# Patient Record
Sex: Female | Born: 1980 | Race: White | Hispanic: No | Marital: Married | Smoking: Never smoker
Health system: Southern US, Community
[De-identification: ages and names within clinical notes are randomized; demographics above are authoritative.]

## PROBLEM LIST (undated history)

## (undated) DIAGNOSIS — F32A Depression, unspecified: Secondary | ICD-10-CM

## (undated) DIAGNOSIS — E039 Hypothyroidism, unspecified: Secondary | ICD-10-CM

## (undated) DIAGNOSIS — F419 Anxiety disorder, unspecified: Secondary | ICD-10-CM

## (undated) DIAGNOSIS — N200 Calculus of kidney: Secondary | ICD-10-CM

## (undated) DIAGNOSIS — IMO0001 Reserved for inherently not codable concepts without codable children: Secondary | ICD-10-CM

## (undated) DIAGNOSIS — Z9889 Other specified postprocedural states: Secondary | ICD-10-CM

## (undated) DIAGNOSIS — R002 Palpitations: Secondary | ICD-10-CM

## (undated) DIAGNOSIS — M1990 Unspecified osteoarthritis, unspecified site: Secondary | ICD-10-CM

## (undated) DIAGNOSIS — O3660X0 Maternal care for excessive fetal growth, unspecified trimester, not applicable or unspecified: Secondary | ICD-10-CM

## (undated) DIAGNOSIS — I517 Cardiomegaly: Secondary | ICD-10-CM

## (undated) DIAGNOSIS — D369 Benign neoplasm, unspecified site: Secondary | ICD-10-CM

## (undated) DIAGNOSIS — I340 Nonrheumatic mitral (valve) insufficiency: Secondary | ICD-10-CM

## (undated) DIAGNOSIS — E079 Disorder of thyroid, unspecified: Secondary | ICD-10-CM

## (undated) HISTORY — PX: COLONOSCOPY: SHX174

## (undated) HISTORY — DX: Hypothyroidism, unspecified: E03.9

## (undated) HISTORY — DX: Reserved for inherently not codable concepts without codable children: IMO0001

## (undated) HISTORY — PX: DILATION AND CURETTAGE OF UTERUS: SHX78

## (undated) HISTORY — DX: Maternal care for excessive fetal growth, unspecified trimester, not applicable or unspecified: O36.60X0

## (undated) HISTORY — DX: Benign neoplasm, unspecified site: D36.9

## (undated) HISTORY — DX: Palpitations: R00.2

---

## 2007-09-03 ENCOUNTER — Emergency Department (HOSPITAL_COMMUNITY): Admission: EM | Admit: 2007-09-03 | Discharge: 2007-09-04 | Payer: Self-pay | Admitting: Emergency Medicine

## 2008-11-27 ENCOUNTER — Emergency Department (HOSPITAL_BASED_OUTPATIENT_CLINIC_OR_DEPARTMENT_OTHER): Admission: EM | Admit: 2008-11-27 | Discharge: 2008-11-27 | Payer: Self-pay | Admitting: Emergency Medicine

## 2011-10-01 LAB — URINE MICROSCOPIC-ADD ON

## 2011-10-01 LAB — URINALYSIS, ROUTINE W REFLEX MICROSCOPIC
Bilirubin Urine: NEGATIVE
Glucose, UA: NEGATIVE
Ketones, ur: NEGATIVE
Protein, ur: 30 — AB
Urobilinogen, UA: 0.2

## 2011-10-01 LAB — POCT PREGNANCY, URINE: Preg Test, Ur: NEGATIVE

## 2012-03-27 ENCOUNTER — Emergency Department (HOSPITAL_BASED_OUTPATIENT_CLINIC_OR_DEPARTMENT_OTHER)
Admission: EM | Admit: 2012-03-27 | Discharge: 2012-03-27 | Disposition: A | Discharge status: Home / Self Care | Payer: Medicaid Other | Attending: Emergency Medicine | Admitting: Emergency Medicine

## 2012-03-27 ENCOUNTER — Emergency Department (INDEPENDENT_AMBULATORY_CARE_PROVIDER_SITE_OTHER): Payer: Medicaid Other

## 2012-03-27 ENCOUNTER — Encounter (HOSPITAL_BASED_OUTPATIENT_CLINIC_OR_DEPARTMENT_OTHER): Payer: Self-pay | Admitting: *Deleted

## 2012-03-27 DIAGNOSIS — R0602 Shortness of breath: Secondary | ICD-10-CM | POA: Insufficient documentation

## 2012-03-27 DIAGNOSIS — R002 Palpitations: Secondary | ICD-10-CM

## 2012-03-27 DIAGNOSIS — I499 Cardiac arrhythmia, unspecified: Secondary | ICD-10-CM | POA: Insufficient documentation

## 2012-03-27 DIAGNOSIS — Z87891 Personal history of nicotine dependence: Secondary | ICD-10-CM | POA: Insufficient documentation

## 2012-03-27 DIAGNOSIS — R079 Chest pain, unspecified: Secondary | ICD-10-CM | POA: Insufficient documentation

## 2012-03-27 DIAGNOSIS — R9389 Abnormal findings on diagnostic imaging of other specified body structures: Secondary | ICD-10-CM

## 2012-03-27 LAB — BASIC METABOLIC PANEL
BUN: 11 mg/dL (ref 6–23)
CO2: 25 mEq/L (ref 19–32)
Chloride: 102 mEq/L (ref 96–112)
GFR calc Af Amer: >90 mL/min (ref >90)
Potassium: 4 mEq/L (ref 3.5–5.1)

## 2012-03-27 LAB — DIFFERENTIAL
Basophils Relative: 0 % (ref 0–1)
Lymphocytes Relative: 29 % (ref 12–46)
Monocytes Relative: 8 % (ref 3–12)
Neutro Abs: 8.1 10*3/uL — ABNORMAL HIGH (ref 1.7–7.7)
Neutrophils Relative %: 61 % (ref 43–77)

## 2012-03-27 LAB — CBC
Hemoglobin: 13.7 g/dL (ref 12.0–15.0)
MCHC: 34.8 g/dL (ref 30.0–36.0)
RBC: 4.5 MIL/uL (ref 3.87–5.11)
WBC: 13.2 10*3/uL — ABNORMAL HIGH (ref 4.0–10.5)

## 2012-03-27 LAB — D-DIMER, QUANTITATIVE: D-Dimer, Quant: 0.3 ug/mL-FEU (ref 0.00–0.48)

## 2012-03-27 NOTE — ED Provider Notes (Signed)
History     CSN: [id number]  Arrival date & time 03/27/12  1927   First MD Initiated Contact with Patient 03/27/12 2027     9:17 PM HPI Pt reports SOB with Exertion since yesterday. Reports today began having palpitations that would occur every 5-10 minutes. Describes palpitations as a fluttering feeling. Also reports for the last 2 weeks has had intermittent chest pain. Reports FHx of early ACS, recent Road trip to [facility] a week ago, and she was a former smoker.  Patient is a 31 y.o. female presenting with palpitations. The history is provided by the patient.  Palpitations  This is a new problem. The current episode started 12 to 24 hours ago. The problem has been gradually worsening. The problem is associated with exercise. Associated symptoms include chest pain, irregular heartbeat and shortness of breath. Pertinent negatives include no fever, no syncope, no abdominal pain, no nausea, no vomiting, no headaches, no back pain, no leg pain, no weakness and no hemoptysis. She has tried bed rest for the symptoms. The treatment provided no relief.    History reviewed. No pertinent past medical history.  Past Surgical History  Procedure Date  . Cesarean section     No family history on file.  History  Substance Use Topics  . Smoking status: Former Games developer  . Smokeless tobacco: Never Used  . Alcohol Use: No    OB History    Grav Para Term Preterm Abortions TAB SAB Ect Mult Living                  Review of Systems  Constitutional: Negative for fever.  Respiratory: Positive for shortness of breath. Negative for hemoptysis.   Cardiovascular: Positive for chest pain and palpitations. Negative for syncope.  Gastrointestinal: Negative for nausea, vomiting and abdominal pain.  Musculoskeletal: Negative for back pain.  Neurological: Negative for weakness and headaches.  All other systems reviewed and are negative.    Allergies  Review of patient's allergies indicates no known  allergies.  Home Medications   Current Outpatient Rx  Name Route Sig Dispense Refill  . SLEEP AID PO Oral Take 2 tablets by mouth daily as needed. Patient used this medication to aid with her sleep.    . IBUPROFEN 200 MG PO TABS Oral Take 400 mg by mouth every 6 (six) hours as needed. Patient used this medication for her headache.      BP 132/86  Pulse 92  Temp(Src) 98.3 F (36.8 C) (Oral)  Resp 16  SpO2 98%  LMP 02/24/2012  Physical Exam  Vitals reviewed. Constitutional: She is oriented to person, place, and time. Vital signs are normal. She appears well-developed and well-nourished. No distress.  HENT:  Head: Normocephalic and atraumatic.  Eyes: Conjunctivae are normal. Pupils are equal, round, and reactive to light.  Neck: Normal range of motion. Neck supple.  Cardiovascular: Normal rate, regular rhythm and normal heart sounds.  Exam reveals no friction rub.   No murmur heard. Pulmonary/Chest: Effort normal and breath sounds normal. She has no wheezes. She has no rhonchi. She has no rales. She exhibits no tenderness.  Abdominal: Soft. Bowel sounds are normal. She exhibits no distension and no mass. There is no tenderness. There is no rebound and no guarding.  Musculoskeletal: Normal range of motion.  Neurological: She is alert and oriented to person, place, and time. Coordination normal.  Skin: Skin is warm and dry. No rash noted. No erythema. No pallor.    ED Course  Procedures  ED ECG REPORT   Date: 03/27/2012  EKG Time: 9:30 PM  Rate: 94  Rhythm: normal sinus rhythm,  there are no previous tracings available for comparison  Axis: Normal  Intervals:none  ST&T Change: Inverted T wave in V1-V3  Narrative Interpretation: abnormal but patient states several years ago she was told she had an abnormal EKG.     Results for orders placed during the hospital encounter of 03/27/12  CBC      Component Value Range   WBC 13.2 (*) 4.0 - 10.5 (K/uL)   RBC 4.50  3.87 - 5.11  (MIL/uL)   Hemoglobin 13.7  12.0 - 15.0 (g/dL)   HCT 16.1  09.6 - 04.5 (%)   MCV 87.6  78.0 - 100.0 (fL)   MCH 30.4  26.0 - 34.0 (pg)   MCHC 34.8  30.0 - 36.0 (g/dL)   RDW 40.9  81.1 - 91.4 (%)   Platelets 308  150 - 400 (K/uL)  DIFFERENTIAL      Component Value Range   Neutrophils Relative 61  43 - 77 (%)   Neutro Abs 8.1 (*) 1.7 - 7.7 (K/uL)   Lymphocytes Relative 29  12 - 46 (%)   Lymphs Abs 3.8  0.7 - 4.0 (K/uL)   Monocytes Relative 8  3 - 12 (%)   Monocytes Absolute 1.0  0.1 - 1.0 (K/uL)   Eosinophils Relative 2  0 - 5 (%)   Eosinophils Absolute 0.3  0.0 - 0.7 (K/uL)   Basophils Relative 0  0 - 1 (%)   Basophils Absolute 0.1  0.0 - 0.1 (K/uL)  BASIC METABOLIC PANEL      Component Value Range   Sodium 140  135 - 145 (mEq/L)   Potassium 4.0  3.5 - 5.1 (mEq/L)   Chloride 102  96 - 112 (mEq/L)   CO2 25  19 - 32 (mEq/L)   Glucose, Bld 95  70 - 99 (mg/dL)   BUN 11  6 - 23 (mg/dL)   Creatinine, Ser 7.82  0.50 - 1.10 (mg/dL)   Calcium 9.4  8.4 - 95.6 (mg/dL)   GFR calc non Af Amer >90  >90 (mL/min)   GFR calc Af Amer >90  >90 (mL/min)  D-DIMER, QUANTITATIVE      Component Value Range   D-Dimer, Quant 0.30  0.00 - 0.48 (ug/mL-FEU)  TROPONIN I      Component Value Range   Troponin I <0.30  <0.30 (ng/mL)   Dg Chest 2 View  03/27/2012  *RADIOLOGY REPORT*  Clinical Data: Palpitations and shortness of breath.  CHEST - 2 VIEW  Comparison: None.  Findings: The lungs are well-aerated.  Mild retrocardiac opacity is not well characterized on the lateral view and may simply reflect atelectasis.  There is no evidence of pleural effusion or pneumothorax.  The heart is normal in size; the mediastinal contour is within normal limits.  No acute osseous abnormalities are seen.  IMPRESSION: Mild retrocardiac opacity is not well characterized on the lateral view and may simply reflect atelectasis, though mild pneumonia cannot be entirely excluded.  Original Report Authenticated By: Tonia Ghent,  M.D.     MDM   Discussed Labs and imaging with patient and family. Advised no acute findings. D-dimer and troponin were normal. Advised close f/u with cardiology for further evaluation. Discussed inverted T-waves with patient. Patient voicing understanding and will return to the ED for worsening symptoms.        Thomasene Lot, PA-C 03/27/12  2340 

## 2012-03-27 NOTE — Discharge Instructions (Signed)
Palpitations  A palpitation is the feeling that your heartbeat is irregular or is faster than normal. Although this is frightening, it usually is not serious. Palpitations may be caused by excesses of smoking, caffeine, or alcohol. They are also brought on by stress and anxiety. Sometimes, they are caused by heart disease. Unless otherwise noted, your caregiver did not find any signs of serious illness at this time. HOME CARE INSTRUCTIONS  To help prevent palpitations:  Drink decaffeinated coffee, tea, and soda pop. Avoid chocolate.   If you smoke or drink alcohol, quit or cut down as much as possible.   Reduce your stress or anxiety level. Biofeedback, yoga, or meditation will help you relax. Physical activity such as swimming, jogging, or walking also may be helpful.  SEEK MEDICAL CARE IF:   You continue to have a fast heartbeat.   Your palpitations occur more often.  SEEK IMMEDIATE MEDICAL CARE IF: You develop chest pain, shortness of breath, severe headache, dizziness, or fainting. Document Released: 12/03/2000 Document Revised: 11/25/2011 Document Reviewed: 02/02/2008 ExitCare Patient Information 2012 [employer]. 

## 2012-03-27 NOTE — ED Notes (Signed)
Pt reports palpitations and SOB since yesterday evening- denies pain- states "it feels like a flutter"

## 2012-04-15 NOTE — ED Provider Notes (Signed)
Evaluation and management procedures were performed by the PA/NP/resident physician under my supervision/collaboration.   Mykel Mohl D Euell Schiff, MD 04/15/12 2010 

## 2014-10-08 ENCOUNTER — Encounter (HOSPITAL_BASED_OUTPATIENT_CLINIC_OR_DEPARTMENT_OTHER): Payer: Self-pay | Admitting: Emergency Medicine

## 2014-10-08 ENCOUNTER — Emergency Department (HOSPITAL_BASED_OUTPATIENT_CLINIC_OR_DEPARTMENT_OTHER): Payer: Medicaid Other

## 2014-10-08 ENCOUNTER — Emergency Department (HOSPITAL_BASED_OUTPATIENT_CLINIC_OR_DEPARTMENT_OTHER)
Admission: EM | Admit: 2014-10-08 | Discharge: 2014-10-08 | Disposition: A | Discharge status: Home / Self Care | Payer: Medicaid Other | Attending: Emergency Medicine | Admitting: Emergency Medicine

## 2014-10-08 DIAGNOSIS — N3001 Acute cystitis with hematuria: Secondary | ICD-10-CM | POA: Insufficient documentation

## 2014-10-08 DIAGNOSIS — Z3202 Encounter for pregnancy test, result negative: Secondary | ICD-10-CM | POA: Insufficient documentation

## 2014-10-08 DIAGNOSIS — Z87891 Personal history of nicotine dependence: Secondary | ICD-10-CM | POA: Insufficient documentation

## 2014-10-08 DIAGNOSIS — Z87442 Personal history of urinary calculi: Secondary | ICD-10-CM | POA: Insufficient documentation

## 2014-10-08 DIAGNOSIS — M549 Dorsalgia, unspecified: Secondary | ICD-10-CM | POA: Diagnosis present

## 2014-10-08 HISTORY — DX: Calculus of kidney: N20.0

## 2014-10-08 LAB — URINALYSIS, ROUTINE W REFLEX MICROSCOPIC
BILIRUBIN URINE: NEGATIVE
Glucose, UA: NEGATIVE mg/dL
KETONES UR: NEGATIVE mg/dL
NITRITE: NEGATIVE
PH: 6 (ref 5.0–8.0)
Protein, ur: 30 mg/dL — AB
SPECIFIC GRAVITY, URINE: 1.028 (ref 1.005–1.030)
Urobilinogen, UA: 0.2 mg/dL (ref 0.0–1.0)

## 2014-10-08 LAB — CBC
HCT: 39.8 % (ref 36.0–46.0)
Hemoglobin: 13.3 g/dL (ref 12.0–15.0)
MCH: 29.4 pg (ref 26.0–34.0)
MCHC: 33.4 g/dL (ref 30.0–36.0)
MCV: 88.1 fL (ref 78.0–100.0)
PLATELETS: 310 10*3/uL (ref 150–400)
RBC: 4.52 MIL/uL (ref 3.87–5.11)
RDW: 12.6 % (ref 11.5–15.5)
WBC: 11.3 10*3/uL — AB (ref 4.0–10.5)

## 2014-10-08 LAB — BASIC METABOLIC PANEL
ANION GAP: 14 (ref 5–15)
BUN: 12 mg/dL (ref 6–23)
CHLORIDE: 101 meq/L (ref 96–112)
CO2: 26 mEq/L (ref 19–32)
Calcium: 9.1 mg/dL (ref 8.4–10.5)
Creatinine, Ser: 0.7 mg/dL (ref 0.50–1.10)
Glucose, Bld: 95 mg/dL (ref 70–99)
POTASSIUM: 4.3 meq/L (ref 3.7–5.3)
SODIUM: 141 meq/L (ref 137–147)

## 2014-10-08 LAB — PREGNANCY, URINE: Preg Test, Ur: NEGATIVE

## 2014-10-08 LAB — URINE MICROSCOPIC-ADD ON

## 2014-10-08 MED ORDER — CEFTRIAXONE SODIUM 1 G IJ SOLR
INTRAMUSCULAR | Status: AC
Start: 2014-10-08 — End: 2014-10-08
  Filled 2014-10-08: qty 10

## 2014-10-08 MED ORDER — HYDROCODONE-ACETAMINOPHEN 5-325 MG PO TABS: 1.0000 | ORAL_TABLET | Freq: Four times a day (QID) | ORAL | Status: DC | PRN

## 2014-10-08 MED ORDER — MORPHINE SULFATE 4 MG/ML IJ SOLN
4.0000 mg | Freq: Once | INTRAMUSCULAR | Status: AC
  Administered 2014-10-08: 4 mg via INTRAVENOUS
  Filled 2014-10-08: qty 1

## 2014-10-08 MED ORDER — CEFTRIAXONE SODIUM 1 G IJ SOLR
1.0000 g | Freq: Once | INTRAMUSCULAR | Status: AC
  Administered 2014-10-08: 1 g via INTRAVENOUS

## 2014-10-08 MED ORDER — CEPHALEXIN 500 MG PO CAPS: 500.0000 mg | ORAL_CAPSULE | Freq: Two times a day (BID) | ORAL | Status: DC

## 2014-10-08 MED ORDER — SODIUM CHLORIDE 0.9 % IV BOLUS (SEPSIS)
1000.0000 mL | Freq: Once | INTRAVENOUS | Status: AC
  Administered 2014-10-08: 1000 mL via INTRAVENOUS

## 2014-10-08 NOTE — ED Notes (Signed)
Pt c/o lower back pain x 1 day HX kidneystones

## 2014-10-08 NOTE — Discharge Instructions (Signed)

## 2014-10-08 NOTE — ED Provider Notes (Signed)
CSN: [id number]     Arrival date & time 10/08/14  1413 History   First MD Initiated Contact with Patient 10/08/14 1502     Chief Complaint  Patient presents with  . Back Pain     (Consider location/radiation/quality/duration/timing/severity/associated sxs/prior Treatment) Patient is a 33 y.o. female presenting with back pain.  Back Pain Location:  Lumbar spine Quality:  Aching Radiates to:  Does not radiate Pain severity:  Moderate Pain is:  Same all the time Onset quality:  Gradual Duration:  1 day Timing:  Constant Progression:  Unchanged Chronicity:  New Relieved by:  Nothing Worsened by:  Nothing tried Associated symptoms: abdominal pain and dysuria   Associated symptoms: no chest pain, no fever, no numbness, no paresthesias and no weakness     Past Medical History  Diagnosis Date  . Kidney stone    Past Surgical History  Procedure Laterality Date  . Cesarean section     History reviewed. No pertinent family history. History  Substance Use Topics  . Smoking status: Former Research scientist (life sciences)  . Smokeless tobacco: Never Used  . Alcohol Use: No   OB History   Grav Para Term Preterm Abortions TAB SAB Ect Mult Living                 Review of Systems  Constitutional: Negative for fever.  Cardiovascular: Negative for chest pain.  Gastrointestinal: Positive for abdominal pain.  Genitourinary: Positive for dysuria.  Musculoskeletal: Positive for back pain.  Neurological: Negative for weakness, numbness and paresthesias.  All other systems reviewed and are negative.     Allergies  Review of patient's allergies indicates no known allergies.  Home Medications   Prior to Admission medications   Medication Sig Start Date End Date Taking? Authorizing Provider  Doxylamine Succinate, Sleep, (SLEEP AID PO) Take 2 tablets by mouth daily as needed. Patient used this medication to aid with her sleep.    Historical Provider, MD  ibuprofen (ADVIL,MOTRIN) 200 MG tablet Take 400  mg by mouth every 6 (six) hours as needed. Patient used this medication for her headache.    Historical Provider, MD   BP 139/84  Pulse 72  Temp(Src) 98.3 F (36.8 C) (Oral)  Resp 16  SpO2 100%  LMP 09/10/2014 Physical Exam  Nursing note and vitals reviewed. Constitutional: She is oriented to person, place, and time. She appears well-developed and well-nourished. No distress.  HENT:  Head: Normocephalic and atraumatic.  Mouth/Throat: Oropharynx is clear and moist.  Eyes: EOM are normal. Pupils are equal, round, and reactive to light.  Neck: Normal range of motion. Neck supple.  Cardiovascular: Normal rate and regular rhythm.  Exam reveals no friction rub.   No murmur heard. Pulmonary/Chest: Effort normal and breath sounds normal. No respiratory distress. She has no wheezes. She has no rales.  Abdominal: Soft. She exhibits no distension. There is tenderness (very mild RLQ). There is no rebound.  Musculoskeletal: Normal range of motion. She exhibits no edema.       Lumbar back: She exhibits tenderness (bilateral musculoskeletal, no midline). She exhibits normal range of motion and no bony tenderness.  Neurological: She is alert and oriented to person, place, and time.  Skin: No rash noted. She is not diaphoretic.    ED Course  Procedures (including critical care time) Labs Review Labs Reviewed  URINALYSIS, ROUTINE W REFLEX MICROSCOPIC - Abnormal; Notable for the following:    APPearance CLOUDY (*)    Hgb urine dipstick MODERATE (*)  Protein, ur 30 (*)    Leukocytes, UA LARGE (*)    All other components within normal limits  URINE MICROSCOPIC-ADD ON - Abnormal; Notable for the following:    Bacteria, UA MANY (*)    All other components within normal limits  PREGNANCY, URINE  CBC  BASIC METABOLIC PANEL    Imaging Review Ct Renal Stone Study  10/08/2014   CLINICAL DATA:  Kidney stone, Bilateral lower back pain, hematuria for 1 day, history of kidney stones  EXAM: CT  ABDOMEN AND PELVIS WITHOUT CONTRAST  TECHNIQUE: Multidetector CT imaging of the abdomen and pelvis was performed following the standard protocol without IV contrast.  COMPARISON:  None.  FINDINGS: Lung bases are unremarkable. Sagittal images of the spine shows mild degenerative changes thoracic spine.  Unenhanced liver shows no biliary ductal dilatation. No calcified gallstones are noted within gallbladder. Unenhanced pancreas, spleen and adrenal glands are unremarkable. Unenhanced kidneys are symmetrical in size. No nephrolithiasis. No hydronephrosis or hydroureter. No calcified ureteral calculi are noted bilaterally.  No aortic aneurysm.  Moderate colonic stool. No pericecal inflammation. Normal appendix.  Bilateral distal ureter is unremarkable. Unenhanced uterus is unremarkable. There is a left ovarian cyst measures 2.8 cm. A right pelvic phlebolith is noted.  IMPRESSION: 1. No nephrolithiasis.  No hydronephrosis or hydroureter. 2. No calcified ureteral calculi are noted. 3. Normal appendix. No pericecal inflammation. Moderate colonic stool. 4. A left ovarian cyst measures 2.8 cm.   Electronically Signed   By: Lahoma Crocker M.D.   On: 10/08/2014 15:53     EKG Interpretation None      MDM   Final diagnoses:  Back pain  Acute cystitis with hematuria    19F with hx of kidney stones presents with back pain since last night. No fever, no nausea/vomiting. Having some hematuria since last night also. No complicated kidney stones, never required stents or lithotripsy. Here vitals stable, very mild RLQ abdominal pain. Mild lower back pain, no midline tenderness.  Will scan for stone. UTI noted, will give rocephin. Scan negative. Will treat for UTI. Stable for discharge.  Evelina Bucy, MD 10/08/14 1728

## 2014-12-16 ENCOUNTER — Encounter (HOSPITAL_BASED_OUTPATIENT_CLINIC_OR_DEPARTMENT_OTHER): Payer: Self-pay

## 2014-12-16 ENCOUNTER — Emergency Department (HOSPITAL_BASED_OUTPATIENT_CLINIC_OR_DEPARTMENT_OTHER): Payer: Medicaid Other

## 2014-12-16 ENCOUNTER — Emergency Department (HOSPITAL_BASED_OUTPATIENT_CLINIC_OR_DEPARTMENT_OTHER)
Admission: EM | Admit: 2014-12-16 | Discharge: 2014-12-16 | Disposition: A | Discharge status: Home / Self Care | Payer: Medicaid Other | Attending: Emergency Medicine | Admitting: Emergency Medicine

## 2014-12-16 DIAGNOSIS — Z87442 Personal history of urinary calculi: Secondary | ICD-10-CM | POA: Diagnosis not present

## 2014-12-16 DIAGNOSIS — R059 Cough, unspecified: Secondary | ICD-10-CM

## 2014-12-16 DIAGNOSIS — Z87891 Personal history of nicotine dependence: Secondary | ICD-10-CM | POA: Diagnosis not present

## 2014-12-16 DIAGNOSIS — R05 Cough: Secondary | ICD-10-CM | POA: Insufficient documentation

## 2014-12-16 DIAGNOSIS — Z792 Long term (current) use of antibiotics: Secondary | ICD-10-CM | POA: Diagnosis not present

## 2014-12-16 DIAGNOSIS — Z79899 Other long term (current) drug therapy: Secondary | ICD-10-CM | POA: Diagnosis not present

## 2014-12-16 DIAGNOSIS — Z8701 Personal history of pneumonia (recurrent): Secondary | ICD-10-CM | POA: Insufficient documentation

## 2014-12-16 MED ORDER — ALBUTEROL SULFATE HFA 108 (90 BASE) MCG/ACT IN AERS
2.0000 | INHALATION_SPRAY | RESPIRATORY_TRACT | Status: DC | PRN
  Administered 2014-12-16: 2 via RESPIRATORY_TRACT
  Filled 2014-12-16: qty 6.7

## 2014-12-16 NOTE — Discharge Instructions (Signed)
Cough, Adult   A cough is a reflex. It helps you clear your throat and airways. A cough can help heal your body. A cough can last 2 or 3 weeks (acute) or may last more than 8 weeks (chronic). Some common causes of a cough can include an infection, allergy, or a cold.  HOME CARE  · Only take medicine as told by your doctor.  · If given, take your medicines (antibiotics) as told. Finish them even if you start to feel better.  · Use a cold steam vaporizer or humidifier in your home. This can help loosen thick spit (secretions).  · Sleep so you are almost sitting up (semi-upright). Use pillows to do this. This helps reduce coughing.  · Rest as needed.  · Stop smoking if you smoke.  GET HELP RIGHT AWAY IF:  · You have yellowish-white fluid (pus) in your thick spit.  · Your cough gets worse.  · Your medicine does not reduce coughing, and you are losing sleep.  · You cough up blood.  · You have trouble breathing.  · Your pain gets worse and medicine does not help.  · You have a fever.  MAKE SURE YOU:   · Understand these instructions.  · Will watch your condition.  · Will get help right away if you are not doing well or get worse.  Document Released: 08/19/2011 Document Revised: 04/22/2014 Document Reviewed: 08/19/2011  ExitCare® Patient Information ©2015 [employer]. This information is not intended to replace advice given to you by your health care provider. Make sure you discuss any questions you have with your health care provider.

## 2014-12-16 NOTE — ED Notes (Signed)
Pt reports did not have a chest xray but MD told her that her lungs had crackles.  Pt reports also has not been able to eat in 5 days.

## 2014-12-16 NOTE — ED Provider Notes (Signed)
CSN: [id number]     Arrival date & time 12/16/14  1359 History   First MD Initiated Contact with Patient 12/16/14 1458     Chief Complaint  Patient presents with  . Cough     (Consider location/radiation/quality/duration/timing/severity/associated sxs/prior Treatment) HPI Comments: Pt comes in with complaint of continued coughing. Pt was seen on Saturday and diagnosed with pneumonia. Pt was treated with inhaler, steroids, antibiotics and cough medication. Pt states that she is not having fever but the cough is persistent  The history is provided by the patient. No language interpreter was used.    Past Medical History  Diagnosis Date  . Kidney stone    Past Surgical History  Procedure Laterality Date  . Cesarean section     No family history on file. History  Substance Use Topics  . Smoking status: Former Research scientist (life sciences)  . Smokeless tobacco: Never Used  . Alcohol Use: No   OB History    No data available     Review of Systems  All other systems reviewed and are negative.     Allergies  Review of patient's allergies indicates no known allergies.  Home Medications   Prior to Admission medications   Medication Sig Start Date End Date Taking? Authorizing Provider  albuterol (PROVENTIL HFA;VENTOLIN HFA) 108 (90 BASE) MCG/ACT inhaler Inhale 2 puffs into the lungs every 6 (six) hours as needed for wheezing or shortness of breath.   Yes Historical Provider, MD  guaiFENesin-codeine (ROBITUSSIN AC) 100-10 MG/5ML syrup Take 5 mLs by mouth 3 (three) times daily as needed for cough.   Yes Historical Provider, MD  sulfamethoxazole-trimethoprim (BACTRIM DS,SEPTRA DS) 800-160 MG per tablet Take 1 tablet by mouth 2 (two) times daily.   Yes Historical Provider, MD  cephALEXin (KEFLEX) 500 MG capsule Take 1 capsule (500 mg total) by mouth 2 (two) times daily. 10/08/14   Evelina Bucy, MD  Doxylamine Succinate, Sleep, (SLEEP AID PO) Take 2 tablets by mouth daily as needed. Patient used this  medication to aid with her sleep.    Historical Provider, MD  HYDROcodone-acetaminophen (NORCO/VICODIN) 5-325 MG per tablet Take 1 tablet by mouth every 6 (six) hours as needed for moderate pain. 10/08/14   Evelina Bucy, MD  ibuprofen (ADVIL,MOTRIN) 200 MG tablet Take 400 mg by mouth every 6 (six) hours as needed. Patient used this medication for her headache.    Historical Provider, MD   BP 119/73 mmHg  Pulse 95  Temp(Src) 99 F (37.2 C) (Oral)  Resp 18  Ht 5\' 2"  (1.575 m)  Wt 194 lb (87.998 kg)  BMI 35.47 kg/m2  SpO2 96%  LMP 12/12/2014 Physical Exam  Constitutional: She is oriented to person, place, and time. She appears well-developed and well-nourished.  HENT:  Head: Normocephalic.  Cardiovascular: Normal rate and regular rhythm.   Pulmonary/Chest: Effort normal and breath sounds normal.  Abdominal: Soft. Bowel sounds are normal.  Musculoskeletal: Normal range of motion.  Neurological: She is alert and oriented to person, place, and time. Coordination normal.  Skin: Skin is warm and dry.  Psychiatric: She has a normal mood and affect.  Nursing note and vitals reviewed.   ED Course  Procedures (including critical care time) Labs Review Labs Reviewed - No data to display  Imaging Review Dg Chest 2 View  12/16/2014   CLINICAL DATA:  Cough and chest pain. Congestion for a few days. Nonsmoker.  EXAM: CHEST  2 VIEW  COMPARISON:  03/27/2012  FINDINGS: The heart size and mediastinal  contours are within normal limits. Both lungs are clear. The visualized skeletal structures are unremarkable.  IMPRESSION: No active cardiopulmonary disease.   Electronically Signed   By: Shon Hale M.D.   On: 12/16/2014 14:37     EKG Interpretation None      MDM   Final diagnoses:  Cough    No infection noted on x-ray. Pt given albuterol inhaler.don't think further change is medication is needed at this time    Glendell Docker, NP 12/16/14 1544  Pamella Pert, MD 12/16/14  1550

## 2014-12-16 NOTE — ED Notes (Signed)
Pt reports dx with bronchitis and pna on Saturday.  Pt reports been taking antbx and received a steriod shot and inhaler but not getting any better.  Reports painful in her chest when she coughs.

## 2015-04-04 ENCOUNTER — Emergency Department (HOSPITAL_BASED_OUTPATIENT_CLINIC_OR_DEPARTMENT_OTHER): Payer: Medicaid Other

## 2015-04-04 ENCOUNTER — Emergency Department (HOSPITAL_BASED_OUTPATIENT_CLINIC_OR_DEPARTMENT_OTHER)
Admission: EM | Admit: 2015-04-04 | Discharge: 2015-04-04 | Disposition: A | Discharge status: Home / Self Care | Payer: Medicaid Other | Attending: Emergency Medicine | Admitting: Emergency Medicine

## 2015-04-04 ENCOUNTER — Encounter (HOSPITAL_BASED_OUTPATIENT_CLINIC_OR_DEPARTMENT_OTHER): Payer: Self-pay

## 2015-04-04 DIAGNOSIS — Z87442 Personal history of urinary calculi: Secondary | ICD-10-CM | POA: Insufficient documentation

## 2015-04-04 DIAGNOSIS — R079 Chest pain, unspecified: Secondary | ICD-10-CM | POA: Insufficient documentation

## 2015-04-04 DIAGNOSIS — R002 Palpitations: Secondary | ICD-10-CM | POA: Insufficient documentation

## 2015-04-04 DIAGNOSIS — Z79899 Other long term (current) drug therapy: Secondary | ICD-10-CM | POA: Insufficient documentation

## 2015-04-04 DIAGNOSIS — Z87891 Personal history of nicotine dependence: Secondary | ICD-10-CM | POA: Insufficient documentation

## 2015-04-04 DIAGNOSIS — R0602 Shortness of breath: Secondary | ICD-10-CM | POA: Insufficient documentation

## 2015-04-04 DIAGNOSIS — I2510 Atherosclerotic heart disease of native coronary artery without angina pectoris: Secondary | ICD-10-CM | POA: Insufficient documentation

## 2015-04-04 DIAGNOSIS — F419 Anxiety disorder, unspecified: Secondary | ICD-10-CM | POA: Insufficient documentation

## 2015-04-04 DIAGNOSIS — Z3202 Encounter for pregnancy test, result negative: Secondary | ICD-10-CM | POA: Insufficient documentation

## 2015-04-04 DIAGNOSIS — Z792 Long term (current) use of antibiotics: Secondary | ICD-10-CM | POA: Insufficient documentation

## 2015-04-04 HISTORY — DX: Cardiomegaly: I51.7

## 2015-04-04 HISTORY — DX: Nonrheumatic mitral (valve) insufficiency: I34.0

## 2015-04-04 LAB — CBC WITH DIFFERENTIAL/PLATELET
BASOS PCT: 0 % (ref 0–1)
Basophils Absolute: 0 10*3/uL (ref 0.0–0.1)
Eosinophils Absolute: 0 10*3/uL (ref 0.0–0.7)
Eosinophils Relative: 0 % (ref 0–5)
HCT: 44.1 % (ref 36.0–46.0)
Hemoglobin: 15 g/dL (ref 12.0–15.0)
LYMPHS ABS: 1.5 10*3/uL (ref 0.7–4.0)
LYMPHS PCT: 13 % (ref 12–46)
MCH: 30.5 pg (ref 26.0–34.0)
MCHC: 34 g/dL (ref 30.0–36.0)
MCV: 89.6 fL (ref 78.0–100.0)
MONOS PCT: 6 % (ref 3–12)
Monocytes Absolute: 0.7 10*3/uL (ref 0.1–1.0)
Neutro Abs: 9.2 10*3/uL — ABNORMAL HIGH (ref 1.7–7.7)
Neutrophils Relative %: 81 % — ABNORMAL HIGH (ref 43–77)
PLATELETS: 365 10*3/uL (ref 150–400)
RBC: 4.92 MIL/uL (ref 3.87–5.11)
RDW: 13 % (ref 11.5–15.5)
WBC: 11.5 10*3/uL — ABNORMAL HIGH (ref 4.0–10.5)

## 2015-04-04 LAB — BASIC METABOLIC PANEL
Anion gap: 12 (ref 5–15)
BUN: 7 mg/dL (ref 6–23)
CALCIUM: 9.3 mg/dL (ref 8.4–10.5)
CO2: 23 mmol/L (ref 19–32)
CREATININE: 0.55 mg/dL (ref 0.50–1.10)
Chloride: 102 mmol/L (ref 96–112)
Glucose, Bld: 111 mg/dL — ABNORMAL HIGH (ref 70–99)
Potassium: 3.2 mmol/L — ABNORMAL LOW (ref 3.5–5.1)
SODIUM: 137 mmol/L (ref 135–145)

## 2015-04-04 LAB — TROPONIN I: Troponin I: <0.03 ng/mL (ref <0.031)

## 2015-04-04 LAB — PREGNANCY, URINE: PREG TEST UR: NEGATIVE

## 2015-04-04 LAB — D-DIMER, QUANTITATIVE: D-Dimer, Quant: 1.18 ug/mL-FEU — ABNORMAL HIGH (ref 0.00–0.48)

## 2015-04-04 MED ORDER — LORAZEPAM 1 MG PO TABS
1.0000 mg | ORAL_TABLET | Freq: Once | ORAL | Status: AC
  Administered 2015-04-04: 1 mg via ORAL
  Filled 2015-04-04: qty 1

## 2015-04-04 MED ORDER — IOHEXOL 350 MG/ML SOLN
100.0000 mL | Freq: Once | INTRAVENOUS | Status: AC | PRN
  Administered 2015-04-04: 100 mL via INTRAVENOUS

## 2015-04-04 NOTE — ED Notes (Signed)
Pt reports palpitations and shortness of breath since last night.

## 2015-04-04 NOTE — ED Notes (Signed)
Patient transported to CT.  Delay in CT was because pt wanted to wait until her Sister arrived before getting the Ct.

## 2015-04-04 NOTE — ED Notes (Signed)
MD at bedside. 

## 2015-04-04 NOTE — Discharge Instructions (Signed)
Palpitations °A palpitation is the feeling that your heartbeat is irregular or is faster than normal. It may feel like your heart is fluttering or skipping a beat. Palpitations are usually not a serious problem. However, in some cases, you may need further medical evaluation. °CAUSES  °Palpitations can be caused by: °· Smoking. °· Caffeine or other stimulants, such as diet pills or energy drinks. °· Alcohol. °· Stress and anxiety. °· Strenuous physical activity. °· Fatigue. °· Certain medicines. °· Heart disease, especially if you have a history of irregular heart rhythms (arrhythmias), such as atrial fibrillation, atrial flutter, or supraventricular tachycardia. °· An improperly working pacemaker or defibrillator. °DIAGNOSIS  °To find the cause of your palpitations, your health care provider will take your medical history and perform a physical exam. Your health care provider may also have you take a test called an ambulatory electrocardiogram (ECG). An ECG records your heartbeat patterns over a 24-hour period. You may also have other tests, such as: °· Transthoracic echocardiogram (TTE). During echocardiography, sound waves are used to evaluate how blood flows through your heart. °· Transesophageal echocardiogram (TEE). °· Cardiac monitoring. This allows your health care provider to monitor your heart rate and rhythm in real time. °· Holter monitor. This is a portable device that records your heartbeat and can help diagnose heart arrhythmias. It allows your health care provider to track your heart activity for several days, if needed. °· Stress tests by exercise or by giving medicine that makes the heart beat faster. °TREATMENT  °Treatment of palpitations depends on the cause of your symptoms and can vary greatly. Most cases of palpitations do not require any treatment other than time, relaxation, and monitoring your symptoms. Other causes, such as atrial fibrillation, atrial flutter, or supraventricular  tachycardia, usually require further treatment. °HOME CARE INSTRUCTIONS  °· Avoid: °· Caffeinated coffee, tea, soft drinks, diet pills, and energy drinks. °· Chocolate. °· Alcohol. °· Stop smoking if you smoke. °· Reduce your stress and anxiety. Things that can help you relax include: °· A method of controlling things in your body, such as your heartbeats, with your mind (biofeedback). °· Yoga. °· Meditation. °· Physical activity such as swimming, jogging, or walking. °· Get plenty of rest and sleep. °SEEK MEDICAL CARE IF:  °· You continue to have a fast or irregular heartbeat beyond 24 hours. °· Your palpitations occur more often. °SEEK IMMEDIATE MEDICAL CARE IF: °· You have chest pain or shortness of breath. °· You have a severe headache. °· You feel dizzy or you faint. °MAKE SURE YOU: °· Understand these instructions. °· Will watch your condition. °· Will get help right away if you are not doing well or get worse. °Document Released: 12/03/2000 Document Revised: 12/11/2013 Document Reviewed: 02/04/2012 °Exit[employer]® Patient Information ©2015 [employer]. This information is not intended to replace advice given to you by your health care provider. Make sure you discuss any questions you have with your health care provider. ° °Panic Attacks °Panic attacks are sudden, short-lived surges of severe anxiety, fear, or discomfort. They may occur for no reason when you are relaxed, when you are anxious, or when you are sleeping. Panic attacks may occur for a number of reasons:  °· Healthy people occasionally have panic attacks in extreme, life-threatening situations, such as war or natural disasters. Normal anxiety is a protective mechanism of the body that helps us react to danger (fight or flight response). °· Panic attacks are often seen with anxiety disorders, such as panic disorder, social anxiety   disorder, generalized anxiety disorder, and phobias. Anxiety disorders cause excessive or uncontrollable anxiety. They may  interfere with your relationships or other life activities. °· Panic attacks are sometimes seen with other mental illnesses, such as depression and posttraumatic stress disorder. °· Certain medical conditions, prescription medicines, and drugs of abuse can cause panic attacks. °SYMPTOMS  °Panic attacks start suddenly, peak within 20 minutes, and are accompanied by four or more of the following symptoms: °· Pounding heart or fast heart rate (palpitations). °· Sweating. °· Trembling or shaking. °· Shortness of breath or feeling smothered. °· Feeling choked. °· Chest pain or discomfort. °· Nausea or strange feeling in your stomach. °· Dizziness, light-headedness, or feeling like you will faint. °· Chills or hot flushes. °· Numbness or tingling in your lips or hands and feet. °· Feeling that things are not real or feeling that you are not yourself. °· Fear of losing control or going crazy. °· Fear of dying. °Some of these symptoms can mimic serious medical conditions. For example, you may think you are having a heart attack. Although panic attacks can be very scary, they are not life threatening. °DIAGNOSIS  °Panic attacks are diagnosed through an assessment by your health care provider. Your health care provider will ask questions about your symptoms, such as where and when they occurred. Your health care provider will also ask about your medical history and use of alcohol and drugs, including prescription medicines. Your health care provider may order blood tests or other studies to rule out a serious medical condition. Your health care provider may refer you to a mental health professional for further evaluation. °TREATMENT  °· Most healthy people who have one or two panic attacks in an extreme, life-threatening situation will not require treatment. °· The treatment for panic attacks associated with anxiety disorders or other mental illness typically involves counseling with a mental health professional, medicine, or  a combination of both. Your health care provider will help determine what treatment is best for you. °· Panic attacks due to physical illness usually go away with treatment of the illness. If prescription medicine is causing panic attacks, talk with your health care provider about stopping the medicine, decreasing the dose, or substituting another medicine. °· Panic attacks due to alcohol or drug abuse go away with abstinence. Some adults need professional help in order to stop drinking or using drugs. °HOME CARE INSTRUCTIONS  °· Take all medicines as directed by your health care provider.   °· Schedule and attend follow-up visits as directed by your health care provider. It is important to keep all your appointments. °SEEK MEDICAL CARE IF: °· You are not able to take your medicines as prescribed. °· Your symptoms do not improve or get worse. °SEEK IMMEDIATE MEDICAL CARE IF:  °· You experience panic attack symptoms that are different than your usual symptoms. °· You have serious thoughts about hurting yourself or others. °· You are taking medicine for panic attacks and have a serious side effect. °MAKE SURE YOU: °· Understand these instructions. °· Will watch your condition. °· Will get help right away if you are not doing well or get worse. °Document Released: 12/06/2005 Document Revised: 12/11/2013 Document Reviewed: 07/20/2013 °ExitCare® Patient Information ©2015 [employer]. This information is not intended to replace advice given to you by your health care provider. Make sure you discuss any questions you have with your health care provider. ° °

## 2015-04-05 NOTE — ED Provider Notes (Signed)
CSN: [id number]     Arrival date & time 04/04/15  1606 History   First MD Initiated Contact with Patient 04/04/15 1623     Chief Complaint  Patient presents with  . Palpitations     (Consider location/radiation/quality/duration/timing/severity/associated sxs/prior Treatment) HPI  This is a 34 -year-old who presents with chest pain. Patient reports that since 245 this morning she's been able to "catch my breath." She reports palpitations and chest pressure. She reports that has progressively gotten worse. Currently pain is 8/10. She cannot identify any alleviating or worsening factors. She has a history of anxiety but states that this feels worse. She took 0.5 of alprazolam which did not help. Denies any recent surgeries, estrogen use, lower leg swelling, history of DVT. She is former smoker.  Past Medical History  Diagnosis Date  . Kidney stone   . Mitral valve regurgitation   . Ventricular hypertrophy left  . Coronary artery disease    Past Surgical History  Procedure Laterality Date  . Cesarean section     History reviewed. No pertinent family history. History  Substance Use Topics  . Smoking status: Former Research scientist (life sciences)  . Smokeless tobacco: Never Used  . Alcohol Use: No   OB History    No data available     Review of Systems  Constitutional: Negative for fever.  Respiratory: Positive for chest tightness and shortness of breath. Negative for cough.   Cardiovascular: Positive for chest pain and palpitations. Negative for leg swelling.  Gastrointestinal: Negative for nausea, vomiting and abdominal pain.  Genitourinary: Negative for dysuria.  Musculoskeletal: Negative for back pain.  Neurological: Negative for headaches.  Psychiatric/Behavioral: Negative for confusion. The patient is nervous/anxious.   All other systems reviewed and are negative.     Allergies  Review of patient's allergies indicates no known allergies.  Home Medications   Prior to Admission  medications   Medication Sig Start Date End Date Taking? Authorizing Provider  ALPRAZolam Duanne Moron) 0.5 MG tablet Take 0.5 mg by mouth at bedtime as needed for anxiety.   Yes Historical Provider, MD  levothyroxine (SYNTHROID, LEVOTHROID) 125 MCG tablet Take 125 mcg by mouth daily before breakfast.   Yes Historical Provider, MD  zolpidem (AMBIEN) 5 MG tablet Take 5 mg by mouth at bedtime as needed for sleep.   Yes Historical Provider, MD  albuterol (PROVENTIL HFA;VENTOLIN HFA) 108 (90 BASE) MCG/ACT inhaler Inhale 2 puffs into the lungs every 6 (six) hours as needed for wheezing or shortness of breath.    Historical Provider, MD  cephALEXin (KEFLEX) 500 MG capsule Take 1 capsule (500 mg total) by mouth 2 (two) times daily. 10/08/14   Evelina Bucy, MD  Doxylamine Succinate, Sleep, (SLEEP AID PO) Take 2 tablets by mouth daily as needed. Patient used this medication to aid with her sleep.    Historical Provider, MD  guaiFENesin-codeine (ROBITUSSIN AC) 100-10 MG/5ML syrup Take 5 mLs by mouth 3 (three) times daily as needed for cough.    Historical Provider, MD  HYDROcodone-acetaminophen (NORCO/VICODIN) 5-325 MG per tablet Take 1 tablet by mouth every 6 (six) hours as needed for moderate pain. 10/08/14   Evelina Bucy, MD  ibuprofen (ADVIL,MOTRIN) 200 MG tablet Take 400 mg by mouth every 6 (six) hours as needed. Patient used this medication for her headache.    Historical Provider, MD  sulfamethoxazole-trimethoprim (BACTRIM DS,SEPTRA DS) 800-160 MG per tablet Take 1 tablet by mouth 2 (two) times daily.    Historical Provider, MD   BP 91/66 mmHg  Pulse 86  Temp(Src) 98.4 F (36.9 C) (Oral)  Resp 18  SpO2 99%  LMP 03/22/2015 Physical Exam  Constitutional: She is oriented to person, place, and time. She appears well-developed and well-nourished. No distress.  Anxious appearing  HENT:  Head: Normocephalic and atraumatic.  Eyes: Pupils are equal, round, and reactive to light.  Cardiovascular: Normal rate  and regular rhythm.   No murmur heard. Pulmonary/Chest: Effort normal and breath sounds normal. No respiratory distress. She has no wheezes.  Abdominal: Soft. Bowel sounds are normal. There is no tenderness. There is no rebound.  Musculoskeletal: She exhibits no edema.  Neurological: She is alert and oriented to person, place, and time.  Skin: Skin is warm and dry.  Psychiatric: She has a normal mood and affect.  Nursing note and vitals reviewed.   ED Course  Procedures (including critical care time) Labs Review Labs Reviewed  CBC WITH DIFFERENTIAL/PLATELET - Abnormal; Notable for the following:    WBC 11.5 (*)    Neutrophils Relative % 81 (*)    Neutro Abs 9.2 (*)    All other components within normal limits  BASIC METABOLIC PANEL - Abnormal; Notable for the following:    Potassium 3.2 (*)    Glucose, Bld 111 (*)    All other components within normal limits  D-DIMER, QUANTITATIVE - Abnormal; Notable for the following:    D-Dimer, Quant 1.18 (*)    All other components within normal limits  TROPONIN I  PREGNANCY, URINE    Imaging Review No results found.   EKG Interpretation   Date/Time:  Friday April 04 2015 16:11:00 EDT Ventricular Rate:  123 PR Interval:  146 QRS Duration: 82 QT Interval:  322 QTC Calculation: 460 R Axis:   43 Text Interpretation:  Sinus tachycardia Nonspecific ST and T wave  abnormality Abnormal ECG Confirmed by Danna Sewell  MD, Loma Sousa ([id number]) on  04/04/2015 4:49:00 PM      MDM   Final diagnoses:  Heart palpitations  Anxiety   Patient presents with chest pain, palpitations, and difficulty "catching her breath." Nontoxic on exam. Vital signs notable for tachycardia. Otherwise reassuring. Physical exam is benign. Low risk for blood clots. We'll send screening d-dimer. Otherwise chest x-ray and EKG reassuring as is other basic labwork. D-dimer positive. Patient sent for CT imaging of the chest which is negative for PE. Patient given Ativan reports  resolution of her symptoms. Suspect component of anxiety contributing to patient's symptoms. She would like to be discharged home. Discussed with patient she could return precautions.  After history, exam, and medical workup I feel the patient has been appropriately medically screened and is safe for discharge home. Pertinent diagnoses were discussed with the patient. Patient was given return precautions.     Merryl Hacker, MD 04/07/15 [phone]

## 2016-12-05 ENCOUNTER — Emergency Department (HOSPITAL_BASED_OUTPATIENT_CLINIC_OR_DEPARTMENT_OTHER)
Admission: EM | Admit: 2016-12-05 | Discharge: 2016-12-05 | Disposition: A | Discharge status: Home / Self Care | Payer: Medicaid Other | Attending: Emergency Medicine | Admitting: Emergency Medicine

## 2016-12-05 ENCOUNTER — Encounter (HOSPITAL_BASED_OUTPATIENT_CLINIC_OR_DEPARTMENT_OTHER): Payer: Self-pay | Admitting: Emergency Medicine

## 2016-12-05 DIAGNOSIS — Y929 Unspecified place or not applicable: Secondary | ICD-10-CM | POA: Insufficient documentation

## 2016-12-05 DIAGNOSIS — I2510 Atherosclerotic heart disease of native coronary artery without angina pectoris: Secondary | ICD-10-CM | POA: Insufficient documentation

## 2016-12-05 DIAGNOSIS — Z87891 Personal history of nicotine dependence: Secondary | ICD-10-CM | POA: Insufficient documentation

## 2016-12-05 DIAGNOSIS — Z79899 Other long term (current) drug therapy: Secondary | ICD-10-CM | POA: Diagnosis not present

## 2016-12-05 DIAGNOSIS — Z23 Encounter for immunization: Secondary | ICD-10-CM | POA: Diagnosis not present

## 2016-12-05 DIAGNOSIS — Y999 Unspecified external cause status: Secondary | ICD-10-CM | POA: Diagnosis not present

## 2016-12-05 DIAGNOSIS — S61310A Laceration without foreign body of right index finger with damage to nail, initial encounter: Secondary | ICD-10-CM | POA: Insufficient documentation

## 2016-12-05 DIAGNOSIS — W260XXA Contact with knife, initial encounter: Secondary | ICD-10-CM | POA: Diagnosis not present

## 2016-12-05 DIAGNOSIS — Y9389 Activity, other specified: Secondary | ICD-10-CM | POA: Insufficient documentation

## 2016-12-05 HISTORY — DX: Disorder of thyroid, unspecified: E07.9

## 2016-12-05 MED ORDER — TETANUS-DIPHTH-ACELL PERTUSSIS 5-2.5-18.5 LF-MCG/0.5 IM SUSP
0.5000 mL | Freq: Once | INTRAMUSCULAR | Status: AC
  Administered 2016-12-05: 0.5 mL via INTRAMUSCULAR
  Filled 2016-12-05: qty 0.5

## 2016-12-05 NOTE — Discharge Instructions (Signed)
Keep finger clean and dry.  Keep dressing in place while touching anything dirty, cooking, cleaning, etc to prevent infection. Follow-up with your primary care doctor. Return here for any new concerns.

## 2016-12-05 NOTE — ED Provider Notes (Signed)
[facility] Provider Note   CSN: [id number] Arrival date & time: 12/05/16  2157  By signing my name below, I, Soijett Blue, attest that this documentation has been prepared under the direction and in the presence of Quincy Carnes, PA-C Electronically Signed: Soijett Blue, ED Scribe. 12/05/16. 10:20 PM.  History   Chief Complaint Chief Complaint  Patient presents with  . Extremity Laceration    HPI Jenna Patterson is a 35 y.o. female who presents to the Emergency Department complaining of right index finger laceration onset PTA. She reports that she cut her right index finger on a cooking knife while making dinner. Pt notes that she is not UTD with her tetanus vaccination. Pt is left hand dominant. She is having associated symptoms of right index finger pain. She has tried applying pressure without medications for the relief of her symptoms. She denies color change, swelling, and any other symptoms.   The history is provided by the patient. No language interpreter was used.    Past Medical History:  Diagnosis Date  . Coronary artery disease   . Kidney stone   . Mitral valve regurgitation   . Thyroid disease   . Ventricular hypertrophy left    There are no active problems to display for this patient.   Past Surgical History:  Procedure Laterality Date  . CESAREAN SECTION      OB History    No data available       Home Medications    Prior to Admission medications   Medication Sig Start Date End Date Taking? Authorizing Provider  albuterol (PROVENTIL HFA;VENTOLIN HFA) 108 (90 BASE) MCG/ACT inhaler Inhale 2 puffs into the lungs every 6 (six) hours as needed for wheezing or shortness of breath.    Historical Provider, MD  ALPRAZolam Duanne Moron) 0.5 MG tablet Take 0.5 mg by mouth at bedtime as needed for anxiety.    Historical Provider, MD  cephALEXin (KEFLEX) 500 MG capsule Take 1 capsule (500 mg total) by mouth 2 (two) times daily. 10/08/14   Evelina Bucy, MD    Doxylamine Succinate, Sleep, (SLEEP AID PO) Take 2 tablets by mouth daily as needed. Patient used this medication to aid with her sleep.    Historical Provider, MD  guaiFENesin-codeine (ROBITUSSIN AC) 100-10 MG/5ML syrup Take 5 mLs by mouth 3 (three) times daily as needed for cough.    Historical Provider, MD  HYDROcodone-acetaminophen (NORCO/VICODIN) 5-325 MG per tablet Take 1 tablet by mouth every 6 (six) hours as needed for moderate pain. 10/08/14   Evelina Bucy, MD  ibuprofen (ADVIL,MOTRIN) 200 MG tablet Take 400 mg by mouth every 6 (six) hours as needed. Patient used this medication for her headache.    Historical Provider, MD  levothyroxine (SYNTHROID, LEVOTHROID) 125 MCG tablet Take 125 mcg by mouth daily before breakfast.    Historical Provider, MD  sulfamethoxazole-trimethoprim (BACTRIM DS,SEPTRA DS) 800-160 MG per tablet Take 1 tablet by mouth 2 (two) times daily.    Historical Provider, MD  zolpidem (AMBIEN) 5 MG tablet Take 5 mg by mouth at bedtime as needed for sleep.    Historical Provider, MD    Family History History reviewed. No pertinent family history.  Social History Social History  Substance Use Topics  . Smoking status: Former Research scientist (life sciences)  . Smokeless tobacco: Never Used  . Alcohol use No     Allergies   Patient has no known allergies.   Review of Systems Review of Systems  Musculoskeletal: Positive for arthralgias (right index  finger). Negative for joint swelling.  Skin: Positive for wound (laceration to right index finger). Negative for color change.  All other systems reviewed and are negative.    Physical Exam Updated Vital Signs BP 114/72 (BP Location: Right Arm)   Pulse 84   Temp 98.1 F (36.7 C) (Oral)   Resp 18   Ht 5\' 2"  (1.575 m)   Wt 130 lb (59 kg)   LMP 11/25/2016   SpO2 100%   BMI 23.78 kg/m   Physical Exam  Constitutional: She is oriented to person, place, and time. She appears well-developed and well-nourished. No distress.  HENT:   Head: Normocephalic and atraumatic.  Eyes: EOM are normal.  Neck: Neck supple.  Cardiovascular: Normal rate.   Pulmonary/Chest: Effort normal. No respiratory distress.  Abdominal: She exhibits no distension.  Musculoskeletal: Normal range of motion.       Right hand: She exhibits laceration.       Hands: Avulsion type laceration of distal right index finger, through distal portion of nail; wound is hemostatic; proximal portion of nail bed/matrix remains intact; full flexion/extension of finger maintained; normal cap refill; normal sensation throughout  Neurological: She is alert and oriented to person, place, and time.  Skin: Skin is warm and dry.  Psychiatric: She has a normal mood and affect. Her behavior is normal.  Nursing note and vitals reviewed.   ED Treatments / Results  DIAGNOSTIC STUDIES: Oxygen Saturation is 100% on RA, nl by my interpretation.    COORDINATION OF CARE: 10:17 PM Discussed treatment plan with pt at bedside which includes wound care and update tetanus vaccination and pt agreed to plan.  Procedures Procedures (including critical care time)  Medications Ordered in ED Medications - No data to display   Initial Impression / Assessment and Plan / ED Course  I have reviewed the triage vital signs and the nursing notes.   Clinical Course    35 year old female here with avulsion type laceration of the distal right index finger. Wound is hemostatic. There is damage to the distal nail, however proximal nail bed/matrix remains intact. She has full range of motion of the affected digit. Normal cap refill, normal sensation. Given nature of laceration, it cannot be repaired here.  Tetanus was updated.  Wound cleansed here, dressing applied.  Will d/c home with wound care instructions.  Recommended to keep covered if cooking, cleaning, etc. To help reduce changes of infection.  FU with PCP.  Discussed plan with patient, she acknowledged understanding and agreed with  plan of care.  Return precautions given for new or worsening symptoms.  Final Clinical Impressions(s) / ED Diagnoses   Final diagnoses:  Laceration of right index finger without foreign body with damage to nail, initial encounter    New Prescriptions New Prescriptions   No medications on file   I personally performed the services described in this documentation, which was scribed in my presence. The recorded information has been reviewed and is accurate.    Larene Pickett, PA-C 12/05/16 2256    Isla Pence, MD 12/06/16 0001

## 2016-12-05 NOTE — ED Notes (Signed)
R medial posterior index nail/nail bed tip avulsion, ~0.5cm, oozing blood. Cut with kitchen knife while cutting onions, Tdap updated here tonight, rates pain 5/10, soaking in saline at this time, (denies: nausea or other sx), not on blood thinners, NKDA.

## 2016-12-05 NOTE — ED Triage Notes (Signed)
Right 1st finger laceration  - bleeding controlled at this time with dressing

## 2016-12-05 NOTE — ED Notes (Signed)
EDPA into room, at [facility]. Pt seen by PA prior to RN assessment, see PA notes, pending orders.

## 2017-01-20 ENCOUNTER — Encounter: Payer: Self-pay | Admitting: Family Medicine

## 2017-01-20 ENCOUNTER — Other Ambulatory Visit (HOSPITAL_COMMUNITY)
Admission: RE | Admit: 2017-01-20 | Discharge: 2017-01-20 | Disposition: A | Discharge status: Home / Self Care | Payer: Medicaid Other | Source: Ambulatory Visit | Attending: Family Medicine | Admitting: Family Medicine

## 2017-01-20 ENCOUNTER — Ambulatory Visit (INDEPENDENT_AMBULATORY_CARE_PROVIDER_SITE_OTHER): Payer: Medicaid Other | Admitting: Family Medicine

## 2017-01-20 ENCOUNTER — Ambulatory Visit: Payer: Self-pay

## 2017-01-20 VITALS — BP 107/65 | HR 89 | Wt 170.8 lb

## 2017-01-20 DIAGNOSIS — Z3689 Encounter for other specified antenatal screening: Secondary | ICD-10-CM

## 2017-01-20 DIAGNOSIS — O0990 Supervision of high risk pregnancy, unspecified, unspecified trimester: Secondary | ICD-10-CM | POA: Insufficient documentation

## 2017-01-20 DIAGNOSIS — O3680X0 Pregnancy with inconclusive fetal viability, not applicable or unspecified: Secondary | ICD-10-CM | POA: Diagnosis not present

## 2017-01-20 DIAGNOSIS — Z124 Encounter for screening for malignant neoplasm of cervix: Secondary | ICD-10-CM

## 2017-01-20 DIAGNOSIS — N898 Other specified noninflammatory disorders of vagina: Secondary | ICD-10-CM

## 2017-01-20 DIAGNOSIS — Z113 Encounter for screening for infections with a predominantly sexual mode of transmission: Secondary | ICD-10-CM

## 2017-01-20 DIAGNOSIS — I340 Nonrheumatic mitral (valve) insufficiency: Secondary | ICD-10-CM | POA: Diagnosis not present

## 2017-01-20 DIAGNOSIS — Z01411 Encounter for gynecological examination (general) (routine) with abnormal findings: Secondary | ICD-10-CM | POA: Diagnosis present

## 2017-01-20 DIAGNOSIS — Z1151 Encounter for screening for human papillomavirus (HPV): Secondary | ICD-10-CM | POA: Insufficient documentation

## 2017-01-20 DIAGNOSIS — I517 Cardiomegaly: Secondary | ICD-10-CM | POA: Diagnosis not present

## 2017-01-20 DIAGNOSIS — R87810 Cervical high risk human papillomavirus (HPV) DNA test positive: Secondary | ICD-10-CM | POA: Diagnosis present

## 2017-01-20 DIAGNOSIS — O34219 Maternal care for unspecified type scar from previous cesarean delivery: Secondary | ICD-10-CM | POA: Insufficient documentation

## 2017-01-20 DIAGNOSIS — O0991 Supervision of high risk pregnancy, unspecified, first trimester: Secondary | ICD-10-CM

## 2017-01-20 DIAGNOSIS — O09529 Supervision of elderly multigravida, unspecified trimester: Secondary | ICD-10-CM | POA: Insufficient documentation

## 2017-01-20 DIAGNOSIS — E039 Hypothyroidism, unspecified: Secondary | ICD-10-CM | POA: Insufficient documentation

## 2017-01-20 DIAGNOSIS — O99280 Endocrine, nutritional and metabolic diseases complicating pregnancy, unspecified trimester: Secondary | ICD-10-CM

## 2017-01-20 DIAGNOSIS — E034 Atrophy of thyroid (acquired): Secondary | ICD-10-CM

## 2017-01-20 DIAGNOSIS — O09521 Supervision of elderly multigravida, first trimester: Secondary | ICD-10-CM | POA: Diagnosis not present

## 2017-01-20 LAB — POCT URINALYSIS DIP (DEVICE)
Bilirubin Urine: NEGATIVE
Glucose, UA: NEGATIVE mg/dL
Hgb urine dipstick: NEGATIVE
Ketones, ur: NEGATIVE mg/dL
NITRITE: NEGATIVE
Protein, ur: NEGATIVE mg/dL
Specific Gravity, Urine: 1.02 (ref 1.005–1.030)
UROBILINOGEN UA: 0.2 mg/dL (ref 0.0–1.0)
pH: 5.5 (ref 5.0–8.0)

## 2017-01-20 MED ORDER — TERCONAZOLE 0.8 % VA CREA: 1.0000 | TOPICAL_CREAM | Freq: Every day | VAGINAL | 0 refills | Status: DC

## 2017-01-20 NOTE — Progress Notes (Signed)
Subjective:    Jenna Patterson is a G3P1011 [redacted]w[redacted]d being seen today for her first obstetrical visit.  Her obstetrical history is significant for advanced maternal age, obesity and hypothyroid, MVR and LV enlargement, and prior Csearean Section.. Patient does intend to breast feed. Pregnancy history fully reviewed.  Patient reports no complaints.  Vitals:   01/20/17 1418  BP: 107/65  Pulse: 89  Weight: 170 lb 12.8 oz (77.5 kg)    HISTORY: OB History  Gravida Para Term Preterm AB Living  3 1 1  0 1 1  SAB TAB Ectopic Multiple Live Births  1 0 0 0 1    # Outcome Date GA Lbr Len/2nd Weight Sex Delivery Anes PTL Lv  3 Current           2 Term 03/10/01 [redacted]w[redacted]d  6 lb 13 oz (3.09 kg) F CS-Unspec  N LIV     Birth Comments: had c-section because "her pelvis wasn't big enough)  1 SAB              Past Medical History:  Diagnosis Date  . Coronary artery disease   . Hypothyroidism   . Kidney stone   . Mitral valve regurgitation   . Thyroid disease   . Ventricular hypertrophy left   Past Surgical History:  Procedure Laterality Date  . CESAREAN SECTION     History reviewed. No pertinent family history.   Exam    Uterus:   10 wk size  Pelvic Exam:    Perineum: Normal Perineum   Vulva: normal   Vagina:  normal mucosa, curdlike discharge   Cervix: no bleeding following Pap, no cervical motion tenderness and no lesions   Adnexa: normal adnexa and no mass, fullness, tenderness   Bony Pelvis: average  System: Breast:  normal appearance, no masses or tenderness   Skin: normal coloration and turgor, no rashes    Neurologic: negative   Extremities: normal strength, tone, and muscle mass, no deformities   HEENT extra ocular movement intact, sclera clear, anicteric and oropharynx clear, no lesions   Mouth/Teeth mucous membranes moist, pharynx normal without lesions   Neck supple   Cardiovascular: regular rate and rhythm, II-III/VI SEM   Respiratory:  appears well, vitals normal,  no respiratory distress, acyanotic, normal RR, ear and throat exam is normal, neck free of mass or lymphadenopathy, chest clear, no wheezing, crepitations, rhonchi, normal symmetric air entry   Abdomen: soft, non-tender; bowel sounds normal; no masses,  no organomegaly   Limited TA U/s reveals SIUP with + flicker FHR 0000000   Assessment/Plan:    Pregnancy: G3P1011 1. Supervision of high risk pregnancy in first trimester Baby Rx APP only - Prenatal Profile - Cystic fibrosis diagnostic study - Culture, OB Urine - Pain Mgmt, Profile 6 Conf w/o mM, U - Cytology - PAP - US OB Limited  2. Elderly multigravida in first trimester Genetics referral - [place] MFM Fetal Nuchal Translucency; Future - AMB referral to maternal fetal medicine  3. Hypothyroid in pregnancy, antepartum - TSH - adjust meds accordingly  4. Previous cesarean delivery, antepartum Discussed options, for consideration  5. Non-rheumatic mitral regurgitation States more of an issue when heavier, discussed potential complications of heart disease and impact of pregnancy. - Ambulatory referral to Cardiology  6. Ventricular hypertrophy To cards, will need ECHO - Ambulatory referral to Cardiology  7. Encounter to determine fetal viability of pregnancy, single or unspecified fetus - US OB Limited  8. Vaginal discharge - consistent with yeast  vaginitis Topical treatment - terconazole (TERAZOL 3) 0.8 % vaginal cream; Place 1 applicator vaginally at bedtime.  Dispense: 20 g; Refill: 0   Donnamae Jude 01/20/2017

## 2017-01-20 NOTE — Patient Instructions (Signed)
First Trimester of Pregnancy The first trimester of pregnancy is from week 1 until the end of week 12 (months 1 through 3). A week after a sperm fertilizes an egg, the egg will implant on the wall of the uterus. This embryo will begin to develop into a baby. Genes from you and your partner are forming the baby. The female genes determine whether the baby is a boy or a girl. At 6-8 weeks, the eyes and face are formed, and the heartbeat can be seen on ultrasound. At the end of 12 weeks, all the baby's organs are formed.  Now that you are pregnant, you will want to do everything you can to have a healthy baby. Two of the most important things are to get good prenatal care and to follow your health care provider's instructions. Prenatal care is all the medical care you receive before the baby's birth. This care will help prevent, find, and treat any problems during the pregnancy and childbirth. BODY CHANGES Your body goes through many changes during pregnancy. The changes vary from woman to woman.   You may gain or lose a couple of pounds at first.  You may feel sick to your stomach (nauseous) and throw up (vomit). If the vomiting is uncontrollable, call your health care provider.  You may tire easily.  You may develop headaches that can be relieved by medicines approved by your health care provider.  You may urinate more often. Painful urination may mean you have a bladder infection.  You may develop heartburn as a result of your pregnancy.  You may develop constipation because certain hormones are causing the muscles that push waste through your intestines to slow down.  You may develop hemorrhoids or swollen, bulging veins (varicose veins).  Your breasts may begin to grow larger and become tender. Your nipples may stick out more, and the tissue that surrounds them (areola) may become darker.  Your gums may bleed and may be sensitive to brushing and flossing.  Dark spots or blotches  (chloasma, mask of pregnancy) may develop on your face. This will likely fade after the baby is born.  Your menstrual periods will stop.  You may have a loss of appetite.  You may develop cravings for certain kinds of food.  You may have changes in your emotions from day to day, such as being excited to be pregnant or being concerned that something may go wrong with the pregnancy and baby.  You may have more vivid and strange dreams.  You may have changes in your hair. These can include thickening of your hair, rapid growth, and changes in texture. Some women also have hair loss during or after pregnancy, or hair that feels dry or thin. Your hair will most likely return to normal after your baby is born. WHAT TO EXPECT AT YOUR PRENATAL VISITS During a routine prenatal visit:  You will be weighed to make sure you and the baby are growing normally.  Your blood pressure will be taken.  Your abdomen will be measured to track your baby's growth.  The fetal heartbeat will be listened to starting around week 10 or 12 of your pregnancy.  Test results from any previous visits will be discussed. Your health care provider may ask you:  How you are feeling.  If you are feeling the baby move.  If you have had any abnormal symptoms, such as leaking fluid, bleeding, severe headaches, or abdominal cramping.  If you are using any tobacco  are feeling the baby move.  · If you have had any abnormal symptoms, such as leaking fluid, bleeding, severe headaches, or abdominal cramping.  · If you are using any tobacco products, including cigarettes, chewing tobacco, and electronic cigarettes.  · If you have any questions.  Other tests that may be performed during your first trimester include:  · Blood tests to find your blood type and to check for the presence of any previous infections. They will also be used to check for low iron levels (anemia) and Rh antibodies. Later in the pregnancy, blood tests for diabetes will be done along with other tests if problems develop.  · Urine tests to check for infections, diabetes, or protein in the urine.   · An ultrasound to confirm the proper growth and development of the baby.  · An amniocentesis to check for possible genetic problems.  · Fetal screens for spina bifida and Down syndrome.  · You may need other tests to make sure you and the baby are doing well.  · HIV (human immunodeficiency virus) testing. Routine prenatal testing includes screening for HIV, unless you choose not to have this test.  HOME CARE INSTRUCTIONS   Medicines  · Follow your health care provider's instructions regarding medicine use. Specific medicines may be either safe or unsafe to take during pregnancy.  · Take your prenatal vitamins as directed.  · If you develop constipation, try taking a stool softener if your health care provider approves.  Diet  · Eat regular, well-balanced meals. Choose a variety of foods, such as meat or vegetable-based protein, fish, milk and low-fat dairy products, vegetables, fruits, and whole grain breads and cereals. Your health care provider will help you determine the amount of weight gain that is right for you.  · Avoid raw meat and uncooked cheese. These carry germs that can cause birth defects in the baby.  · Eating four or five small meals rather than three large meals a day may help relieve nausea and vomiting. If you start to feel nauseous, eating a few soda crackers can be helpful. Drinking liquids between meals instead of during meals also seems to help nausea and vomiting.  · If you develop constipation, eat more high-fiber foods, such as fresh vegetables or fruit and whole grains. Drink enough fluids to keep your urine clear or pale yellow.  Activity and Exercise  · Exercise only as directed by your health care provider. Exercising will help you:    Control your weight.    Stay in shape.    Be prepared for labor and delivery.  · Experiencing pain or cramping in the lower abdomen or low back is a good sign that you should stop exercising. Check with your health care provider  before continuing normal exercises.  · Try to avoid standing for long periods of time. Move your legs often if you must stand in one place for a long time.  · Avoid heavy lifting.  · Wear low-heeled shoes, and practice good posture.  · You may continue to have sex unless your health care provider directs you otherwise.  Relief of Pain or Discomfort  · Wear a good support bra for breast tenderness.    · Take warm sitz baths to soothe any pain or discomfort caused by hemorrhoids. Use hemorrhoid cream if your health care provider approves.    · Rest with your legs elevated if you have leg cramps or low back pain.  · If you develop varicose veins in your   prenatal visits as directed by your health care provider. Safety   Wear your seat belt at all times when driving.  Make a list of emergency phone numbers, including numbers for family, friends, the hospital, and police and fire departments. General Tips   Ask your health care provider for a referral to a local prenatal education class. Begin classes no later than at the beginning of month 6 of your pregnancy.  Ask for help if you have counseling or nutritional needs during pregnancy. Your health care provider can offer advice or refer you to specialists for help with various needs.  Do not use hot tubs, steam rooms, or saunas.  Do not douche or use tampons or scented sanitary pads.  Do not cross your legs for long periods of time.  Avoid cat litter boxes and soil used by cats. These carry germs that can cause birth defects in the baby and possibly loss of the fetus by miscarriage or stillbirth.  Avoid all smoking, herbs, alcohol, and  medicines not prescribed by your health care provider. Chemicals in these affect the formation and growth of the baby.  Do not use any tobacco products, including cigarettes, chewing tobacco, and electronic cigarettes. If you need help quitting, ask your health care provider. You may receive counseling support and other resources to help you quit.  Schedule a dentist appointment. At home, brush your teeth with a soft toothbrush and be gentle when you floss. SEEK MEDICAL CARE IF:   You have dizziness.  You have mild pelvic cramps, pelvic pressure, or nagging pain in the abdominal area.  You have persistent nausea, vomiting, or diarrhea.  You have a bad smelling vaginal discharge.  You have pain with urination.  You notice increased swelling in your face, hands, legs, or ankles. SEEK IMMEDIATE MEDICAL CARE IF:   You have a fever.  You are leaking fluid from your vagina.  You have spotting or bleeding from your vagina.  You have severe abdominal cramping or pain.  You have rapid weight gain or loss.  You vomit blood or material that looks like coffee grounds.  You are exposed to [place] measles and have never had them.  You are exposed to fifth disease or chickenpox.  You develop a severe headache.  You have shortness of breath.  You have any kind of trauma, such as from a fall or a car accident. This information is not intended to replace advice given to you by your health care provider. Make sure you discuss any questions you have with your health care provider. Document Released: 11/30/2001 Document Revised: 12/27/2014 Document Reviewed: 10/16/2013 Elsevier Interactive Patient Education  2017 [employer].   Breastfeeding Deciding to breastfeed is one of the best choices you can make for you and your baby. A change in hormones during pregnancy causes your breast tissue to grow and increases the number and size of your milk ducts. These hormones also allow proteins,  sugars, and fats from your blood supply to make breast milk in your milk-producing glands. Hormones prevent breast milk from being released before your baby is born as well as prompt milk flow after birth. Once breastfeeding has begun, thoughts of your baby, as well as his or her sucking or crying, can stimulate the release of milk from your milk-producing glands. Benefits of breastfeeding For Your Baby  Your first milk (colostrum) helps your baby's digestive system function better.  There are antibodies in your milk that help your baby fight off infections.  Your baby has a lower incidence of asthma, allergies, and sudden infant death syndrome.  The nutrients in breast milk are better for your baby than infant formulas and are designed uniquely for your baby's needs.  Breast milk improves your baby's brain development.  Your baby is less likely to develop other conditions, such as childhood obesity, asthma, or type 2 diabetes mellitus. For You  Breastfeeding helps to create a very special bond between you and your baby.  Breastfeeding is convenient. Breast milk is always available at the correct temperature and costs nothing.  Breastfeeding helps to burn calories and helps you lose the weight gained during pregnancy.  Breastfeeding makes your uterus contract to its prepregnancy size faster and slows bleeding (lochia) after you give birth.  Breastfeeding helps to lower your risk of developing type 2 diabetes mellitus, osteoporosis, and breast or ovarian cancer later in life. Signs that your baby is hungry Early Signs of Hunger  Increased alertness or activity.  Stretching.  Movement of the head from side to side.  Movement of the head and opening of the mouth when the corner of the mouth or cheek is stroked (rooting).  Increased sucking sounds, smacking lips, cooing, sighing, or squeaking.  Hand-to-mouth movements.  Increased sucking of fingers or hands. Late Signs of  Hunger  Fussing.  Intermittent crying. Extreme Signs of Hunger  Signs of extreme hunger will require calming and consoling before your baby will be able to breastfeed successfully. Do not wait for the following signs of extreme hunger to occur before you initiate breastfeeding:  Restlessness.  A loud, strong cry.  Screaming. Breastfeeding basics  Breastfeeding Initiation  Find a comfortable place to sit or lie down, with your neck and back well supported.  Place a pillow or rolled up blanket under your baby to bring him or her to the level of your breast (if you are seated). Nursing pillows are specially designed to help support your arms and your baby while you breastfeed.  Make sure that your baby's abdomen is facing your abdomen.  Gently massage your breast. With your fingertips, massage from your chest wall toward your nipple in a circular motion. This encourages milk flow. You may need to continue this action during the feeding if your milk flows slowly.  Support your breast with 4 fingers underneath and your thumb above your nipple. Make sure your fingers are well away from your nipple and your baby's mouth.  Stroke your baby's lips gently with your finger or nipple.  When your baby's mouth is open wide enough, quickly bring your baby to your breast, placing your entire nipple and as much of the colored area around your nipple (areola) as possible into your baby's mouth.  More areola should be visible above your baby's upper lip than below the lower lip.  Your baby's tongue should be between his or her lower gum and your breast.  Ensure that your baby's mouth is correctly positioned around your nipple (latched). Your baby's lips should create a seal on your breast and be turned out (everted).  It is common for your baby to suck about 2-3 minutes in order to start the flow of breast milk. Latching  Teaching your baby how to latch on to your breast properly is very  important. An improper latch can cause nipple pain and decreased milk supply for you and poor weight gain in your baby. Also, if your baby is not latched onto your nipple properly, he or she may swallow  some air during feeding. This can make your baby fussy. Burping your baby when you switch breasts during the feeding can help to get rid of the air. However, teaching your baby to latch on properly is still the best way to prevent fussiness from swallowing air while breastfeeding. Signs that your baby has successfully latched on to your nipple:  Silent tugging or silent sucking, without causing you pain.  Swallowing heard between every 3-4 sucks.  Muscle movement above and in front of his or her ears while sucking. Signs that your baby has not successfully latched on to nipple:  Sucking sounds or smacking sounds from your baby while breastfeeding.  Nipple pain. If you think your baby has not latched on correctly, slip your finger into the corner of your baby's mouth to break the suction and place it between your baby's gums. Attempt breastfeeding initiation again. Signs of Successful Breastfeeding  Signs from your baby:  A gradual decrease in the number of sucks or complete cessation of sucking.  Falling asleep.  Relaxation of his or her body.  Retention of a small amount of milk in his or her mouth.  Letting go of your breast by himself or herself. Signs from you:  Breasts that have increased in firmness, weight, and size 1-3 hours after feeding.  Breasts that are softer immediately after breastfeeding.  Increased milk volume, as well as a change in milk consistency and color by the fifth day of breastfeeding.  Nipples that are not sore, cracked, or bleeding. Signs That Your [employer] is Getting Enough Milk  Wetting at least 1-2 diapers during the first 24 hours after birth.  Wetting at least 5-6 diapers every 24 hours for the first week after birth. The urine should be clear or  pale yellow by 5 days after birth.  Wetting 6-8 diapers every 24 hours as your baby continues to grow and develop.  At least 3 stools in a 24-hour period by age 10 days. The stool should be soft and yellow.  At least 3 stools in a 24-hour period by age 423 days. The stool should be seedy and yellow.  No loss of weight greater than 10% of birth weight during the first 62 days of age.  Average weight gain of 4-7 ounces (113-198 g) per week after age 42 days.  Consistent daily weight gain by age 29 days, without weight loss after the age of 2 weeks. After a feeding, your baby may spit up a small amount. This is common. Breastfeeding frequency and duration Frequent feeding will help you make more milk and can prevent sore nipples and breast engorgement. Breastfeed when you feel the need to reduce the fullness of your breasts or when your baby shows signs of hunger. This is called "breastfeeding on demand." Avoid introducing a pacifier to your baby while you are working to establish breastfeeding (the first 4-6 weeks after your baby is born). After this time you may choose to use a pacifier. Research has shown that pacifier use during the first year of a baby's life decreases the risk of sudden infant death syndrome (SIDS). Allow your baby to feed on each breast as long as he or she wants. Breastfeed until your baby is finished feeding. When your baby unlatches or falls asleep while feeding from the first breast, offer the second breast. Because newborns are often sleepy in the first few weeks of life, you may need to awaken your baby to get him or her to feed. Breastfeeding  times will vary from baby to baby. However, the following rules can serve as a guide to help you ensure that your baby is properly fed:  Newborns (babies 75 weeks of age or younger) may breastfeed every 1-3 hours.  Newborns should not go longer than 3 hours during the day or 5 hours during the night without breastfeeding.  You should  breastfeed your baby a minimum of 8 times in a 24-hour period until you begin to introduce solid foods to your baby at around 72 months of age. Breast milk pumping Pumping and storing breast milk allows you to ensure that your baby is exclusively fed your breast milk, even at times when you are unable to breastfeed. This is especially important if you are going back to work while you are still breastfeeding or when you are not able to be present during feedings. Your lactation consultant can give you guidelines on how long it is safe to store breast milk. A breast pump is a machine that allows you to pump milk from your breast into a sterile bottle. The pumped breast milk can then be stored in a refrigerator or freezer. Some breast pumps are operated by hand, while others use electricity. Ask your lactation consultant which type will work best for you. Breast pumps can be purchased, but some hospitals and breastfeeding support groups lease breast pumps on a monthly basis. A lactation consultant can teach you how to hand express breast milk, if you prefer not to use a pump. Caring for your breasts while you breastfeed Nipples can become dry, cracked, and sore while breastfeeding. The following recommendations can help keep your breasts moisturized and healthy:  Avoid using soap on your nipples.  Wear a supportive bra. Although not required, special nursing bras and tank tops are designed to allow access to your breasts for breastfeeding without taking off your entire bra or top. Avoid wearing underwire-style bras or extremely tight bras.  Air dry your nipples for 3-47minutes after each feeding.  Use only cotton bra pads to absorb leaked breast milk. Leaking of breast milk between feedings is normal.  Use lanolin on your nipples after breastfeeding. Lanolin helps to maintain your skin's normal moisture barrier. If you use pure lanolin, you do not need to wash it off before feeding your baby again. Pure  lanolin is not toxic to your baby. You may also hand express a few drops of breast milk and gently massage that milk into your nipples and allow the milk to air dry. In the first few weeks after giving birth, some women experience extremely full breasts (engorgement). Engorgement can make your breasts feel heavy, warm, and tender to the touch. Engorgement peaks within 3-5 days after you give birth. The following recommendations can help ease engorgement:  Completely empty your breasts while breastfeeding or pumping. You may want to start by applying warm, moist heat (in the shower or with warm water-soaked hand towels) just before feeding or pumping. This increases circulation and helps the milk flow. If your baby does not completely empty your breasts while breastfeeding, pump any extra milk after he or she is finished.  Wear a snug bra (nursing or regular) or tank top for 1-2 days to signal your body to slightly decrease milk production.  Apply ice packs to your breasts, unless this is too uncomfortable for you.  Make sure that your baby is latched on and positioned properly while breastfeeding. If engorgement persists after 48 hours of following these recommendations, contact  your health care provider or a Science writer. Overall health care recommendations while breastfeeding  Eat healthy foods. Alternate between meals and snacks, eating 3 of each per day. Because what you eat affects your breast milk, some of the foods may make your baby more irritable than usual. Avoid eating these foods if you are sure that they are negatively affecting your baby.  Drink milk, fruit juice, and water to satisfy your thirst (about 10 glasses a day).  Rest often, relax, and continue to take your prenatal vitamins to prevent fatigue, stress, and anemia.  Continue breast self-awareness checks.  Avoid chewing and smoking tobacco. Chemicals from cigarettes that pass into breast milk and exposure to  secondhand smoke may harm your baby.  Avoid alcohol and drug use, including marijuana. Some medicines that may be harmful to your baby can pass through breast milk. It is important to ask your health care provider before taking any medicine, including all over-the-counter and prescription medicine as well as vitamin and herbal supplements. It is possible to become pregnant while breastfeeding. If birth control is desired, ask your health care provider about options that will be safe for your baby. Contact a health care provider if:  You feel like you want to stop breastfeeding or have become frustrated with breastfeeding.  You have painful breasts or nipples.  Your nipples are cracked or bleeding.  Your breasts are red, tender, or warm.  You have a swollen area on either breast.  You have a fever or chills.  You have nausea or vomiting.  You have drainage other than breast milk from your nipples.  Your breasts do not become full before feedings by the fifth day after you give birth.  You feel sad and depressed.  Your baby is too sleepy to eat well.  Your baby is having trouble sleeping.  Your baby is wetting less than 3 diapers in a 24-hour period.  Your baby has less than 3 stools in a 24-hour period.  Your baby's skin or the white part of his or her eyes becomes yellow.  Your baby is not gaining weight by 62 days of age. Get help right away if:  Your baby is overly tired (lethargic) and does not want to wake up and feed.  Your baby develops an unexplained fever. This information is not intended to replace advice given to you by your health care provider. Make sure you discuss any questions you have with your health care provider. Document Released: 12/06/2005 Document Revised: 05/19/2016 Document Reviewed: 05/30/2013 Elsevier Interactive Patient Education  2017 [employer].

## 2017-01-20 NOTE — Progress Notes (Signed)
Patient reports white itchy discharge

## 2017-01-20 NOTE — Progress Notes (Signed)
Pt informed that the ultrasound is considered a limited OB ultrasound and is not intended to be a complete ultrasound exam.  Patient also informed that the ultrasound is not being completed with the intent of assessing for fetal or placental anomalies or any pelvic abnormalities.  Explained that the purpose of today's ultrasound is to assess for viability.  Patient acknowledges the purpose of the exam and the limitations of the study.    Single IUP FHR = 175 bpm per M-mode

## 2017-01-21 ENCOUNTER — Other Ambulatory Visit: Payer: Self-pay | Admitting: Family Medicine

## 2017-01-21 DIAGNOSIS — O0991 Supervision of high risk pregnancy, unspecified, first trimester: Secondary | ICD-10-CM

## 2017-01-21 LAB — PRENATAL PROFILE (SOLSTAS)
Antibody Screen: NEGATIVE
BASOS PCT: 0 %
Basophils Absolute: 0 cells/uL (ref 0–200)
Eosinophils Absolute: 96 cells/uL (ref 15–500)
Eosinophils Relative: 1 %
HCT: 37 % (ref 35.0–45.0)
HEP B S AG: NEGATIVE
HIV: NONREACTIVE
Hemoglobin: 12.4 g/dL (ref 11.7–15.5)
Lymphocytes Relative: 24 %
Lymphs Abs: 2304 cells/uL (ref 850–3900)
MCH: 30.8 pg (ref 27.0–33.0)
MCHC: 33.5 g/dL (ref 32.0–36.0)
MCV: 91.8 fL (ref 80.0–100.0)
MPV: 9.4 fL (ref 7.5–12.5)
Monocytes Absolute: 576 cells/uL (ref 200–950)
Monocytes Relative: 6 %
NEUTROS ABS: 6624 {cells}/uL (ref 1500–7800)
Neutrophils Relative %: 69 %
PLATELETS: 373 10*3/uL (ref 140–400)
RBC: 4.03 MIL/uL (ref 3.80–5.10)
RDW: 13.3 % (ref 11.0–15.0)
RUBELLA: 7.05 {index} — AB (ref <0.90)
Rh Type: POSITIVE
WBC: 9.6 10*3/uL (ref 3.8–10.8)

## 2017-01-21 LAB — TSH: TSH: 4.64 mIU/L — ABNORMAL HIGH

## 2017-01-21 LAB — CULTURE, OB URINE

## 2017-01-21 MED ORDER — LEVOTHYROXINE SODIUM 137 MCG PO TABS: 137.0000 ug | ORAL_TABLET | Freq: Every day | ORAL | 1 refills | Status: DC

## 2017-01-23 LAB — PAIN MGMT, PROFILE 6 CONF W/O MM, U
6 Acetylmorphine: NEGATIVE ng/mL (ref <10)
Alcohol Metabolites: NEGATIVE ng/mL (ref <500)
Amphetamines: NEGATIVE ng/mL (ref <500)
BARBITURATES: NEGATIVE ng/mL (ref <300)
Benzodiazepines: NEGATIVE ng/mL (ref <100)
COCAINE METABOLITE: NEGATIVE ng/mL (ref <150)
Codeine: NEGATIVE ng/mL (ref <50)
Creatinine: 73.9 mg/dL (ref >=20.0)
HYDROCODONE: NEGATIVE ng/mL (ref <50)
HYDROMORPHONE: NEGATIVE ng/mL (ref <50)
MARIJUANA METABOLITE: NEGATIVE ng/mL (ref <20)
Methadone Metabolite: NEGATIVE ng/mL (ref <100)
Morphine: NEGATIVE ng/mL (ref <50)
NORHYDROCODONE: NEGATIVE ng/mL (ref <50)
OXYCODONE: NEGATIVE ng/mL (ref <100)
Opiates: NEGATIVE ng/mL (ref <100)
Oxidant: NEGATIVE ug/mL (ref <200)
PH: 6.25 (ref 4.5–9.0)
Phencyclidine: NEGATIVE ng/mL (ref <25)
Please note:: 0

## 2017-01-24 ENCOUNTER — Encounter: Payer: Self-pay | Admitting: Family Medicine

## 2017-01-24 ENCOUNTER — Telehealth: Payer: Self-pay | Admitting: General Practice

## 2017-01-24 DIAGNOSIS — B951 Streptococcus, group B, as the cause of diseases classified elsewhere: Secondary | ICD-10-CM | POA: Insufficient documentation

## 2017-01-24 DIAGNOSIS — O2340 Unspecified infection of urinary tract in pregnancy, unspecified trimester: Principal | ICD-10-CM

## 2017-01-24 MED ORDER — AMOXICILLIN 500 MG PO CAPS: 500.0000 mg | ORAL_CAPSULE | Freq: Three times a day (TID) | ORAL | 0 refills | Status: AC

## 2017-01-24 NOTE — Telephone Encounter (Signed)
Per Dr Kennon Rounds, patient has GBS UTI and needs amoxicillin 500mg  TID x 7days. Meds ordered. Called patient, no answer- left message stating your most recent urine culture shows you have a urinary tract infection & we have sent an antibiotic to your pharmacy. Please go by and pick it up and take as directed. If you have questions you may call us back.

## 2017-01-25 LAB — CYTOLOGY - PAP
Adequacy: ABSENT — AB
Bacterial vaginitis: NEGATIVE
CHLAMYDIA, DNA PROBE: NEGATIVE
Candida vaginitis: POSITIVE — AB
Diagnosis: UNDETERMINED — AB
HPV 16/18/45 GENOTYPING: NEGATIVE
HPV: DETECTED — AB
NEISSERIA GONORRHEA: NEGATIVE
Trichomonas: NEGATIVE

## 2017-01-26 ENCOUNTER — Encounter: Payer: Self-pay | Admitting: Family Medicine

## 2017-01-26 ENCOUNTER — Encounter: Payer: Self-pay | Admitting: *Deleted

## 2017-01-26 DIAGNOSIS — R87810 Cervical high risk human papillomavirus (HPV) DNA test positive: Secondary | ICD-10-CM

## 2017-01-26 DIAGNOSIS — R87610 Atypical squamous cells of undetermined significance on cytologic smear of cervix (ASC-US): Secondary | ICD-10-CM | POA: Insufficient documentation

## 2017-01-26 NOTE — Progress Notes (Signed)
Per request from Dr. Kennon Rounds, Cytology [facility]) was notified to add HPV 16, 18 and 45 typing to Pap test.

## 2017-01-27 ENCOUNTER — Encounter: Payer: Self-pay | Admitting: Physician Assistant

## 2017-01-29 LAB — CFVANTAGE CYSTIC FIB EXP SCREEN

## 2017-01-31 ENCOUNTER — Ambulatory Visit: Payer: Medicaid Other | Admitting: Physician Assistant

## 2017-02-02 ENCOUNTER — Encounter: Payer: Self-pay | Admitting: Physician Assistant

## 2017-02-03 ENCOUNTER — Telehealth: Payer: Self-pay | Admitting: General Practice

## 2017-02-03 NOTE — Telephone Encounter (Signed)
Per Dr Kennon Rounds, patient needs colpo. Called patient & informed her of pap results & explained colposcopy procedure. Told patient this isn't something that would be done at her upcoming visit but would be scheduled for a later date. Patient verbalized understanding to all & had no questions

## 2017-02-14 ENCOUNTER — Ambulatory Visit (INDEPENDENT_AMBULATORY_CARE_PROVIDER_SITE_OTHER): Payer: Medicaid Other | Admitting: Obstetrics & Gynecology

## 2017-02-14 ENCOUNTER — Ambulatory Visit: Payer: Self-pay

## 2017-02-14 ENCOUNTER — Encounter (HOSPITAL_COMMUNITY): Payer: Self-pay | Admitting: Family Medicine

## 2017-02-14 VITALS — BP 100/57 | HR 88 | Wt 178.4 lb

## 2017-02-14 DIAGNOSIS — R87810 Cervical high risk human papillomavirus (HPV) DNA test positive: Secondary | ICD-10-CM

## 2017-02-14 DIAGNOSIS — O3680X0 Pregnancy with inconclusive fetal viability, not applicable or unspecified: Secondary | ICD-10-CM

## 2017-02-14 DIAGNOSIS — O09521 Supervision of elderly multigravida, first trimester: Secondary | ICD-10-CM

## 2017-02-14 DIAGNOSIS — E039 Hypothyroidism, unspecified: Secondary | ICD-10-CM

## 2017-02-14 DIAGNOSIS — O99281 Endocrine, nutritional and metabolic diseases complicating pregnancy, first trimester: Secondary | ICD-10-CM

## 2017-02-14 DIAGNOSIS — I340 Nonrheumatic mitral (valve) insufficiency: Secondary | ICD-10-CM

## 2017-02-14 DIAGNOSIS — O99280 Endocrine, nutritional and metabolic diseases complicating pregnancy, unspecified trimester: Secondary | ICD-10-CM

## 2017-02-14 DIAGNOSIS — R87610 Atypical squamous cells of undetermined significance on cytologic smear of cervix (ASC-US): Secondary | ICD-10-CM

## 2017-02-14 NOTE — Progress Notes (Signed)
Pt informed that the ultrasound is considered a limited OB ultrasound and is not intended to be a complete ultrasound exam.  Patient also informed that the ultrasound is not being completed with the intent of assessing for fetal or placental anomalies or any pelvic abnormalities.  Explained that the purpose of today's ultrasound is to assess for viability.  Patient acknowledges the purpose of the exam and the limitations of the study.    Single IUP FHR - 168 bpm per M-mode FM present

## 2017-02-14 NOTE — Progress Notes (Signed)
Needs to be scheduled for colposcopy at next visit.     PRENATAL VISIT NOTE  Subjective:  Jenna Patterson is a 36 y.o. G3P1011 at [redacted]w[redacted]d being seen today for ongoing prenatal care.  She is currently monitored for the following issues for this high-risk pregnancy and has Supervision of high-risk pregnancy; Advanced maternal age in multigravida; Hypothyroid in pregnancy, antepartum; Hypothyroid; Previous cesarean delivery, antepartum; Mitral valve regurgitation; Ventricular hypertrophy; Group B Streptococcus urinary tract infection affecting pregnancy, antepartum; and ASCUS with positive high risk HPV cervical on her problem list.  Patient reports no complaints.  Contractions: Not present. Vag. Bleeding: None.  Movement: Absent. Denies leaking of fluid.   The following portions of the patient's history were reviewed and updated as appropriate: allergies, current medications, past family history, past medical history, past social history, past surgical history and problem list. Problem list updated.  Objective:   Vitals:   02/14/17 1130  BP: (!) 100/57  Pulse: 88  Weight: 178 lb 6.4 oz (80.9 kg)    Fetal Status:     Movement: Absent     General:  Alert, oriented and cooperative. Patient is in no acute distress.  Skin: Skin is warm and dry. No rash noted.   Cardiovascular: Normal heart rate noted  Respiratory: Normal respiratory effort, no problems with respiration noted  Abdomen: Soft, gravid, appropriate for gestational age. Pain/Pressure: Present     Pelvic:  Cervical exam deferred        Extremities: Normal range of motion.  Edema: None  Mental Status: Normal mood and affect. Normal behavior. Normal judgment and thought content.   Assessment and Plan:  Pregnancy: G3P1011 at [redacted]w[redacted]d  1. Encounter to determine fetal viability of pregnancy, single or unspecified fetus - US OB Limited  2. Non-rheumatic mitral regurgitation Cardiology and echo  3. Hypothyroid in pregnancy,  antepartum Recent increase in synthroid.  Will recheck in mid march  4. ASCUS with positive high risk HPV cervical Colpo to be scheduled  5. Elderly multigravida in first trimester First screen scheduled and needs AFP after 15 weeks - Glucose tolerance, 1 hour (risk based--AMA, obesity) -Anatomy US at 18-20 weeks    Preterm labor symptoms and general obstetric precautions including but not limited to vaginal bleeding, contractions, leaking of fluid and fetal movement were reviewed in detail with the patient. Please refer to After Visit Summary for other counseling recommendations.  No Follow-up on file.   Guss Bunde, MD

## 2017-02-15 LAB — GLUCOSE TOLERANCE, 1 HOUR: Glucose, 1Hr PP: 103 mg/dL (ref 65–199)

## 2017-02-24 ENCOUNTER — Encounter (HOSPITAL_COMMUNITY): Payer: Self-pay

## 2017-02-24 ENCOUNTER — Other Ambulatory Visit: Payer: Self-pay | Admitting: Family Medicine

## 2017-02-24 ENCOUNTER — Ambulatory Visit (HOSPITAL_COMMUNITY)
Admission: RE | Admit: 2017-02-24 | Discharge: 2017-02-24 | Disposition: A | Discharge status: Home / Self Care | Payer: Medicaid Other | Source: Ambulatory Visit | Attending: Family Medicine | Admitting: Family Medicine

## 2017-02-24 VITALS — BP 108/61 | HR 85 | Wt 185.2 lb

## 2017-02-24 DIAGNOSIS — Z3A13 13 weeks gestation of pregnancy: Secondary | ICD-10-CM

## 2017-02-24 DIAGNOSIS — O34211 Maternal care for low transverse scar from previous cesarean delivery: Secondary | ICD-10-CM | POA: Insufficient documentation

## 2017-02-24 DIAGNOSIS — O09529 Supervision of elderly multigravida, unspecified trimester: Secondary | ICD-10-CM

## 2017-02-24 DIAGNOSIS — O09521 Supervision of elderly multigravida, first trimester: Secondary | ICD-10-CM

## 2017-02-24 DIAGNOSIS — O99280 Endocrine, nutritional and metabolic diseases complicating pregnancy, unspecified trimester: Secondary | ICD-10-CM

## 2017-02-24 DIAGNOSIS — O0991 Supervision of high risk pregnancy, unspecified, first trimester: Secondary | ICD-10-CM

## 2017-02-24 DIAGNOSIS — E039 Hypothyroidism, unspecified: Secondary | ICD-10-CM

## 2017-02-24 DIAGNOSIS — O99281 Endocrine, nutritional and metabolic diseases complicating pregnancy, first trimester: Secondary | ICD-10-CM | POA: Insufficient documentation

## 2017-02-24 DIAGNOSIS — Z3682 Encounter for antenatal screening for nuchal translucency: Secondary | ICD-10-CM | POA: Diagnosis present

## 2017-02-24 NOTE — Progress Notes (Signed)
Genetic Counseling  High-Risk Gestation Note  Appointment Date:  02/24/2017 Referred By: Donnamae Jude, MD Date of Birth:  September 29, 1981 Partner: Mamie Laurel   Pregnancy History: [id number] Estimated Date of Delivery: 09/01/17 Estimated Gestational Age: [redacted]w[redacted]d Attending: Benjaman Lobe, MD   Jenna Patterson was seen for genetic counseling because of a maternal age of 36 y.o..     In summary:  Discussed AMA and associated risk for fetal aneuploidy  Discussed options for screening  First screen- declined  Quad screen- declined  NIPS- elected to pursue today (Panorama)  Ultrasound- NT ultrasound performed today and within normal limits; detailed scheduled 04/07/17  Discussed diagnostic testing options  [facility]- declined  Amniocentesis- declined  Reviewed family history concerns  Discussed carrier screening options  CF- within normal limits through OB office  SMA- declined  Hemoglobinopathies- declined  She was counseled regarding maternal age and the association with risk for chromosome conditions due to nondisjunction with aging of the ova.   We reviewed chromosomes, nondisjunction, and the associated 1 in 50 risk for fetal aneuploidy related to a maternal age of 36 y.o. at [redacted]w[redacted]d gestation.  She was counseled that the risk for aneuploidy decreases as gestational age increases, accounting for those pregnancies which spontaneously abort.  We specifically discussed Down syndrome (trisomy 22), trisomies 37 and 62, and sex chromosome aneuploidies (47,XXX and 47,XXY) including the common features and prognoses of each.   We reviewed available screening options including First Screen, Quad screen, noninvasive prenatal screening (NIPS)/cell free DNA (cfDNA) screening, and detailed ultrasound.  She was counseled that screening tests are used to modify a patient's a priori risk for aneuploidy, typically based on age. This estimate provides a pregnancy specific risk assessment. We reviewed  the benefits and limitations of each option. Specifically, we discussed the conditions for which each test screens, the detection rates, and false positive rates of each. She was also counseled regarding diagnostic testing via [facility] and amniocentesis. We reviewed the approximate 1 in [phone] risk for complications from amniocentesis, including spontaneous pregnancy loss. We discussed the possible results that the tests might provide including: positive, negative, unanticipated, and no result. Finally, they were counseled regarding the cost of each option and potential out of pocket expenses. After consideration of all the options, she elected to proceed with NIPS (Panorama through [facility] laboratory).  Those results will be available in 8-10 days.  She declined [facility] and amniocentesis, given the associated risk for complications.   A nuchal translucency ultrasound was performed today and was within normal limits.  The report will be documented separately.  The patient would like to return for a detailed ultrasound at ~18+ weeks gestation.  This appointment was scheduled for 04/07/17. She understands that screening tests cannot rule out all birth defects or genetic syndromes. The patient was advised of this limitation and states she still does not want additional testing at this time.   Ms Areen Trautner was provided with written information regarding cystic fibrosis (CF), spinal muscular atrophy (SMA) and hemoglobinopathies including the carrier frequency, availability of carrier screening and prenatal diagnosis if indicated.  In addition, we discussed that CF and hemoglobinopathies are routinely screened for as part of the [place] newborn screening panel. CF carrier screening was performed through her OB provider and was negative for the causative mutations analyzed. Thus, her risk to be a CF carrier has been reduced. She declined screening for SMA and hemoglobinopathies.  Both family histories were reviewed and found  to be noncontributory for birth  defects, intellectual disability, and known genetic conditions. Without further information regarding the provided family history, an accurate genetic risk cannot be calculated. Further genetic counseling is warranted if more information is obtained.  Ms. Anah Billard denied exposure to environmental toxins or chemical agents. She denied the use of alcohol, tobacco or street drugs. She denied significant viral illnesses during the course of her pregnancy. Her medical and surgical histories were contributory for hypothyroidism, for which she is currently treated with levothyroxine.   I counseled Ms. Denishia Citro regarding the above risks and available options.  The approximate face-to-face time with the genetic counselor was 30 minutes.  Chipper Oman, MS,  Certified Genetic Counselor 02/24/2017

## 2017-03-04 ENCOUNTER — Other Ambulatory Visit: Payer: Self-pay

## 2017-03-07 ENCOUNTER — Telehealth (HOSPITAL_COMMUNITY): Payer: Self-pay | Admitting: MS"

## 2017-03-07 ENCOUNTER — Ambulatory Visit (INDEPENDENT_AMBULATORY_CARE_PROVIDER_SITE_OTHER): Payer: Medicaid Other | Admitting: Obstetrics & Gynecology

## 2017-03-07 VITALS — BP 94/59 | HR 64 | Wt 186.1 lb

## 2017-03-07 DIAGNOSIS — E559 Vitamin D deficiency, unspecified: Secondary | ICD-10-CM

## 2017-03-07 DIAGNOSIS — O99282 Endocrine, nutritional and metabolic diseases complicating pregnancy, second trimester: Secondary | ICD-10-CM

## 2017-03-07 DIAGNOSIS — I340 Nonrheumatic mitral (valve) insufficiency: Secondary | ICD-10-CM

## 2017-03-07 DIAGNOSIS — E039 Hypothyroidism, unspecified: Secondary | ICD-10-CM

## 2017-03-07 DIAGNOSIS — O0992 Supervision of high risk pregnancy, unspecified, second trimester: Secondary | ICD-10-CM

## 2017-03-07 NOTE — Telephone Encounter (Signed)
Attempted to contact patient regarding results of noninvasive prenatal screening (NIPS)/Panorama, which are within normal range. Left message for patient to return call.   Jenna Patterson 03/07/2017 9:39 AM

## 2017-03-07 NOTE — Progress Notes (Signed)
   PRENATAL VISIT NOTE  Subjective:  Jenna Patterson is a 36 y.o. G3P1011 at [redacted]w[redacted]d being seen today for ongoing prenatal care.  She is currently monitored for the following issues for this high-risk pregnancy and has Supervision of high-risk pregnancy; Advanced maternal age in multigravida; Hypothyroid in pregnancy, antepartum; Hypothyroid; Previous cesarean delivery, antepartum; Mitral valve regurgitation; Ventricular hypertrophy; Group B Streptococcus urinary tract infection affecting pregnancy, antepartum; and ASCUS with positive high risk HPV cervical on her problem list.  Patient reports no complaints.  Contractions: Not present. Vag. Bleeding: None.  Movement: Present. Denies leaking of fluid.   The following portions of the patient's history were reviewed and updated as appropriate: allergies, current medications, past family history, past medical history, past social history, past surgical history and problem list. Problem list updated.  Objective:   Vitals:   03/07/17 0934  BP: (!) 94/59  Pulse: 64  Weight: 186 lb 1.6 oz (84.4 kg)    Fetal Status: Fetal Heart Rate (bpm): 158   Movement: Present     General:  Alert, oriented and cooperative. Patient is in no acute distress.  Skin: Skin is warm and dry. No rash noted.   Cardiovascular: Normal heart rate noted  Respiratory: Normal respiratory effort, no problems with respiration noted  Abdomen: Soft, gravid, appropriate for gestational age. Pain/Pressure: Present     Pelvic:  Cervical exam deferred        Extremities: Normal range of motion.  Edema: None  Mental Status: Normal mood and affect. Normal behavior. Normal judgment and thought content.   Assessment and Plan:  Pregnancy: G3P1011 at [redacted]w[redacted]d  1. Hypothyroidism, unspecified type - TSH  2. Vitamin D deficiency - pt still taking 50,000 units weekly; will check level today - VITAMIN D 25 Hydroxy (Vit-D Deficiency, Fractures)  3. Non-rheumatic mitral regurgitation -  Ambulatory referral to Cardiology  4. Supervision of high risk pregnancy in second trimester AFP next visit Anatomy US already scheduled  Preterm labor symptoms and general obstetric precautions including but not limited to vaginal bleeding, contractions, leaking of fluid and fetal movement were reviewed in detail with the patient. Please refer to After Visit Summary for other counseling recommendations.  Return in about 4 weeks (around 04/04/2017).   Guss Bunde, MD

## 2017-03-07 NOTE — Telephone Encounter (Signed)
Called Jenna Patterson to discuss her prenatal cell free DNA test results.  Jenna Patterson had Panorama testing through [facility].  Testing was offered because of maternal age.   The patient was identified by name and DOB.  We reviewed that these are within normal limits, showing a less than 1 in 10,000 risk for trisomies 21, 18 and 13, and monosomy X (Turner syndrome).  In addition, the risk for triploidy and sex chromosome trisomies (47,XXX and 47,XXY) was also low risk. We reviewed that this testing identifies > 99% of pregnancies with trisomy 60, trisomy 44, sex chromosome trisomies (47,XXX and 47,XXY), and triploidy. The detection rate for trisomy 18 is 96%.  The detection rate for monosomy X is ~92%.  The false positive rate is <0.1% for all conditions. Testing was also consistent with female fetal sex.  The patient did wish to know fetal sex.  She understands that this testing does not identify all genetic conditions.  All questions were answered to her satisfaction, she was encouraged to call with additional questions or concerns.  Chipper Oman, MS Insurance risk surveyor

## 2017-03-08 LAB — TSH: TSH: 4.46 u[IU]/mL (ref 0.450–4.500)

## 2017-03-08 LAB — VITAMIN D 25 HYDROXY (VIT D DEFICIENCY, FRACTURES): Vit D, 25-Hydroxy: 23.1 ng/mL — ABNORMAL LOW (ref 30.0–100.0)

## 2017-03-12 ENCOUNTER — Other Ambulatory Visit: Payer: Self-pay | Admitting: Obstetrics & Gynecology

## 2017-03-12 ENCOUNTER — Encounter: Payer: Self-pay | Admitting: Obstetrics & Gynecology

## 2017-03-12 DIAGNOSIS — O99280 Endocrine, nutritional and metabolic diseases complicating pregnancy, unspecified trimester: Principal | ICD-10-CM

## 2017-03-12 DIAGNOSIS — E559 Vitamin D deficiency, unspecified: Secondary | ICD-10-CM | POA: Insufficient documentation

## 2017-03-12 DIAGNOSIS — E039 Hypothyroidism, unspecified: Secondary | ICD-10-CM

## 2017-03-12 MED ORDER — LEVOTHYROXINE SODIUM 150 MCG PO TABS: 150.0000 ug | ORAL_TABLET | Freq: Every day | ORAL | 4 refills | Status: DC

## 2017-03-12 MED ORDER — VITAMIN D 1000 UNITS PO TABS: 2000.0000 [IU] | ORAL_TABLET | Freq: Every day | ORAL | 6 refills | Status: DC

## 2017-03-12 NOTE — Progress Notes (Signed)
Pt's TSH elevated for pregnancy.  Increase synthoid to 150 mcg and redraw TSH in 6 weeks.  Rx at pharmacy.  RN to call patient.

## 2017-03-12 NOTE — Progress Notes (Signed)
In pregnant women with vitamin D deficiency, the safety of 50,000 international units of vitamin D weekly for six to eight weeks has not been adequately studied, so some UpToDate editors treat vitamin D deficient and insufficient pregnant women more slowly by giving a total of 600 to 800 international units of vitamin D3 daily. For pregnant women with vitamin D deficiency, other UpToDate editors agree with ACOG and the Endocrine Society that 1000 to 2000 international units of vitamin D daily is safe [49] and may be necessary to maintain a blood level of 25(OH)D >30 ng/mL [9]. Urinary calcium excretion increases in pregnancy, and it should be monitored when treating vitamin D deficiency, especially in women with a history of renal stones.   Will change dose to 2000 units Vit D daily.  Check levels in 6 weeks.

## 2017-03-28 ENCOUNTER — Ambulatory Visit: Payer: Medicaid Other | Admitting: Physician Assistant

## 2017-04-05 ENCOUNTER — Ambulatory Visit (INDEPENDENT_AMBULATORY_CARE_PROVIDER_SITE_OTHER): Admitting: Family Medicine

## 2017-04-05 VITALS — BP 99/66 | HR 85 | Wt 198.3 lb

## 2017-04-05 DIAGNOSIS — O09522 Supervision of elderly multigravida, second trimester: Secondary | ICD-10-CM

## 2017-04-05 DIAGNOSIS — O0992 Supervision of high risk pregnancy, unspecified, second trimester: Secondary | ICD-10-CM

## 2017-04-05 DIAGNOSIS — O34219 Maternal care for unspecified type scar from previous cesarean delivery: Secondary | ICD-10-CM

## 2017-04-05 DIAGNOSIS — O99282 Endocrine, nutritional and metabolic diseases complicating pregnancy, second trimester: Secondary | ICD-10-CM

## 2017-04-05 DIAGNOSIS — E559 Vitamin D deficiency, unspecified: Secondary | ICD-10-CM

## 2017-04-05 DIAGNOSIS — R87610 Atypical squamous cells of undetermined significance on cytologic smear of cervix (ASC-US): Secondary | ICD-10-CM

## 2017-04-05 DIAGNOSIS — R87810 Cervical high risk human papillomavirus (HPV) DNA test positive: Secondary | ICD-10-CM

## 2017-04-05 DIAGNOSIS — O99280 Endocrine, nutritional and metabolic diseases complicating pregnancy, unspecified trimester: Principal | ICD-10-CM

## 2017-04-05 DIAGNOSIS — E039 Hypothyroidism, unspecified: Secondary | ICD-10-CM

## 2017-04-05 MED ORDER — CALCIUM CARBONATE-VITAMIN D 500-200 MG-UNIT PO TABS: 1.0000 | ORAL_TABLET | Freq: Two times a day (BID) | ORAL | 3 refills | Status: DC

## 2017-04-05 NOTE — Patient Instructions (Signed)
 Second Trimester of Pregnancy The second trimester is from week 14 through week 27 (months 4 through 6). The second trimester is often a time when you feel your best. Your body has adjusted to being pregnant, and you begin to feel better physically. Usually, morning sickness has lessened or quit completely, you may have more energy, and you may have an increase in appetite. The second trimester is also a time when the fetus is growing rapidly. At the end of the sixth month, the fetus is about 9 inches long and weighs about 1 pounds. You will likely begin to feel the baby move (quickening) between 16 and 20 weeks of pregnancy. Body changes during your second trimester Your body continues to go through many changes during your second trimester. The changes vary from woman to woman.  Your weight will continue to increase. You will notice your lower abdomen bulging out.  You may begin to get stretch marks on your hips, abdomen, and breasts.  You may develop headaches that can be relieved by medicines. The medicines should be approved by your health care provider.  You may urinate more often because the fetus is pressing on your bladder.  You may develop or continue to have heartburn as a result of your pregnancy.  You may develop constipation because certain hormones are causing the muscles that push waste through your intestines to slow down.  You may develop hemorrhoids or swollen, bulging veins (varicose veins).  You may have back pain. This is caused by: ? Weight gain. ? Pregnancy hormones that are relaxing the joints in your pelvis. ? A shift in weight and the muscles that support your balance.  Your breasts will continue to grow and they will continue to become tender.  Your gums may bleed and may be sensitive to brushing and flossing.  Dark spots or blotches (chloasma, mask of pregnancy) may develop on your face. This will likely fade after the baby is born.  A dark line from  your belly button to the pubic area (linea nigra) may appear. This will likely fade after the baby is born.  You may have changes in your hair. These can include thickening of your hair, rapid growth, and changes in texture. Some women also have hair loss during or after pregnancy, or hair that feels dry or thin. Your hair will most likely return to normal after your baby is born.  What to expect at prenatal visits During a routine prenatal visit:  You will be weighed to make sure you and the fetus are growing normally.  Your blood pressure will be taken.  Your abdomen will be measured to track your baby's growth.  The fetal heartbeat will be listened to.  Any test results from the previous visit will be discussed.  Your health care provider may ask you:  How you are feeling.  If you are feeling the baby move.  If you have had any abnormal symptoms, such as leaking fluid, bleeding, severe headaches, or abdominal cramping.  If you are using any tobacco products, including cigarettes, chewing tobacco, and electronic cigarettes.  If you have any questions.  Other tests that may be performed during your second trimester include:  Blood tests that check for: ? Low iron levels (anemia). ? High blood sugar that affects pregnant women (gestational diabetes) between 24 and 28 weeks. ? Rh antibodies. This is to check for a protein on red blood cells (Rh factor).  Urine tests to check for infections, diabetes,   or protein in the urine.  An ultrasound to confirm the proper growth and development of the baby.  An amniocentesis to check for possible genetic problems.  Fetal screens for spina bifida and Down syndrome.  HIV (human immunodeficiency virus) testing. Routine prenatal testing includes screening for HIV, unless you choose not to have this test.  Follow these instructions at home: Medicines  Follow your health care provider's instructions regarding medicine use. Specific  medicines may be either safe or unsafe to take during pregnancy.  Take a prenatal vitamin that contains at least 600 micrograms (mcg) of folic acid.  If you develop constipation, try taking a stool softener if your health care provider approves. Eating and drinking  Eat a balanced diet that includes fresh fruits and vegetables, whole grains, good sources of protein such as meat, eggs, or tofu, and low-fat dairy. Your health care provider will help you determine the amount of weight gain that is right for you.  Avoid raw meat and uncooked cheese. These carry germs that can cause birth defects in the baby.  If you have low calcium intake from food, talk to your health care provider about whether you should take a daily calcium supplement.  Limit foods that are high in fat and processed sugars, such as fried and sweet foods.  To prevent constipation: ? Drink enough fluid to keep your urine clear or pale yellow. ? Eat foods that are high in fiber, such as fresh fruits and vegetables, whole grains, and beans. Activity  Exercise only as directed by your health care provider. Most women can continue their usual exercise routine during pregnancy. Try to exercise for 30 minutes at least 5 days a week. Stop exercising if you experience uterine contractions.  Avoid heavy lifting, wear low heel shoes, and practice good posture.  A sexual relationship may be continued unless your health care provider directs you otherwise. Relieving pain and discomfort  Wear a good support bra to prevent discomfort from breast tenderness.  Take warm sitz baths to soothe any pain or discomfort caused by hemorrhoids. Use hemorrhoid cream if your health care provider approves.  Rest with your legs elevated if you have leg cramps or low back pain.  If you develop varicose veins, wear support hose. Elevate your feet for 15 minutes, 3-4 times a day. Limit salt in your diet. Prenatal Care  Write down your questions.  Take them to your prenatal visits.  Keep all your prenatal visits as told by your health care provider. This is important. Safety  Wear your seat belt at all times when driving.  Make a list of emergency phone numbers, including numbers for family, friends, the hospital, and police and fire departments. General instructions  Ask your health care provider for a referral to a local prenatal education class. Begin classes no later than the beginning of month 6 of your pregnancy.  Ask for help if you have counseling or nutritional needs during pregnancy. Your health care provider can offer advice or refer you to specialists for help with various needs.  Do not use hot tubs, steam rooms, or saunas.  Do not douche or use tampons or scented sanitary pads.  Do not cross your legs for long periods of time.  Avoid cat litter boxes and soil used by cats. These carry germs that can cause birth defects in the baby and possibly loss of the fetus by miscarriage or stillbirth.  Avoid all smoking, herbs, alcohol, and unprescribed drugs. Chemicals in these products   can affect the formation and growth of the baby.  Do not use any products that contain nicotine or tobacco, such as cigarettes and e-cigarettes. If you need help quitting, ask your health care provider.  Visit your dentist if you have not gone yet during your pregnancy. Use a soft toothbrush to brush your teeth and be gentle when you floss. Contact a health care provider if:  You have dizziness.  You have mild pelvic cramps, pelvic pressure, or nagging pain in the abdominal area.  You have persistent nausea, vomiting, or diarrhea.  You have a bad smelling vaginal discharge.  You have pain when you urinate. Get help right away if:  You have a fever.  You are leaking fluid from your vagina.  You have spotting or bleeding from your vagina.  You have severe abdominal cramping or pain.  You have rapid weight gain or weight  loss.  You have shortness of breath with chest pain.  You notice sudden or extreme swelling of your face, hands, ankles, feet, or legs.  You have not felt your baby move in over an hour.  You have severe headaches that do not go away when you take medicine.  You have vision changes. Summary  The second trimester is from week 14 through week 27 (months 4 through 6). It is also a time when the fetus is growing rapidly.  Your body goes through many changes during pregnancy. The changes vary from woman to woman.  Avoid all smoking, herbs, alcohol, and unprescribed drugs. These chemicals affect the formation and growth your baby.  Do not use any tobacco products, such as cigarettes, chewing tobacco, and e-cigarettes. If you need help quitting, ask your health care provider.  Contact your health care provider if you have any questions. Keep all prenatal visits as told by your health care provider. This is important. This information is not intended to replace advice given to you by your health care provider. Make sure you discuss any questions you have with your health care provider. Document Released: 11/30/2001 Document Revised: 05/13/2016 Document Reviewed: 02/06/2013 [employer] Interactive Patient Education  2017 [employer]   Breastfeeding Deciding to breastfeed is one of the best choices you can make for you and your baby. A change in hormones during pregnancy causes your breast tissue to grow and increases the number and size of your milk ducts. These hormones also allow proteins, sugars, and fats from your blood supply to make breast milk in your milk-producing glands. Hormones prevent breast milk from being released before your baby is born as well as prompt milk flow after birth. Once breastfeeding has begun, thoughts of your baby, as well as his or her sucking or crying, can stimulate the release of milk from your milk-producing glands. Benefits of breastfeeding For Your  Baby  Your first milk (colostrum) helps your baby's digestive system function better.  There are antibodies in your milk that help your baby fight off infections.  Your baby has a lower incidence of asthma, allergies, and sudden infant death syndrome.  The nutrients in breast milk are better for your baby than infant formulas and are designed uniquely for your baby's needs.  Breast milk improves your baby's brain development.  Your baby is less likely to develop other conditions, such as childhood obesity, asthma, or type 2 diabetes mellitus.  For You  Breastfeeding helps to create a very special bond between you and your baby.  Breastfeeding is convenient. Breast milk is always available at   the correct temperature and costs nothing.  Breastfeeding helps to burn calories and helps you lose the weight gained during pregnancy.  Breastfeeding makes your uterus contract to its prepregnancy size faster and slows bleeding (lochia) after you give birth.  Breastfeeding helps to lower your risk of developing type 2 diabetes mellitus, osteoporosis, and breast or ovarian cancer later in life.  Signs that your baby is hungry Early Signs of Hunger  Increased alertness or activity.  Stretching.  Movement of the head from side to side.  Movement of the head and opening of the mouth when the corner of the mouth or cheek is stroked (rooting).  Increased sucking sounds, smacking lips, cooing, sighing, or squeaking.  Hand-to-mouth movements.  Increased sucking of fingers or hands.  Late Signs of Hunger  Fussing.  Intermittent crying.  Extreme Signs of Hunger Signs of extreme hunger will require calming and consoling before your baby will be able to breastfeed successfully. Do not wait for the following signs of extreme hunger to occur before you initiate breastfeeding:  Restlessness.  A loud, strong cry.  Screaming.  Breastfeeding basics Breastfeeding Initiation  Find a  comfortable place to sit or lie down, with your neck and back well supported.  Place a pillow or rolled up blanket under your baby to bring him or her to the level of your breast (if you are seated). Nursing pillows are specially designed to help support your arms and your baby while you breastfeed.  Make sure that your baby's abdomen is facing your abdomen.  Gently massage your breast. With your fingertips, massage from your chest wall toward your nipple in a circular motion. This encourages milk flow. You may need to continue this action during the feeding if your milk flows slowly.  Support your breast with 4 fingers underneath and your thumb above your nipple. Make sure your fingers are well away from your nipple and your baby's mouth.  Stroke your baby's lips gently with your finger or nipple.  When your baby's mouth is open wide enough, quickly bring your baby to your breast, placing your entire nipple and as much of the colored area around your nipple (areola) as possible into your baby's mouth. ? More areola should be visible above your baby's upper lip than below the lower lip. ? Your baby's tongue should be between his or her lower gum and your breast.  Ensure that your baby's mouth is correctly positioned around your nipple (latched). Your baby's lips should create a seal on your breast and be turned out (everted).  It is common for your baby to suck about 2-3 minutes in order to start the flow of breast milk.  Latching Teaching your baby how to latch on to your breast properly is very important. An improper latch can cause nipple pain and decreased milk supply for you and poor weight gain in your baby. Also, if your baby is not latched onto your nipple properly, he or she may swallow some air during feeding. This can make your baby fussy. Burping your baby when you switch breasts during the feeding can help to get rid of the air. However, teaching your baby to latch on properly is  still the best way to prevent fussiness from swallowing air while breastfeeding. Signs that your baby has successfully latched on to your nipple:  Silent tugging or silent sucking, without causing you pain.  Swallowing heard between every 3-4 sucks.  Muscle movement above and in front of his or her   ears while sucking.  Signs that your baby has not successfully latched on to nipple:  Sucking sounds or smacking sounds from your baby while breastfeeding.  Nipple pain.  If you think your baby has not latched on correctly, slip your finger into the corner of your baby's mouth to break the suction and place it between your baby's gums. Attempt breastfeeding initiation again. Signs of Successful Breastfeeding Signs from your baby:  A gradual decrease in the number of sucks or complete cessation of sucking.  Falling asleep.  Relaxation of his or her body.  Retention of a small amount of milk in his or her mouth.  Letting go of your breast by himself or herself.  Signs from you:  Breasts that have increased in firmness, weight, and size 1-3 hours after feeding.  Breasts that are softer immediately after breastfeeding.  Increased milk volume, as well as a change in milk consistency and color by the fifth day of breastfeeding.  Nipples that are not sore, cracked, or bleeding.  Signs That Your Baby is Getting Enough Milk  Wetting at least 1-2 diapers during the first 24 hours after birth.  Wetting at least 5-6 diapers every 24 hours for the first week after birth. The urine should be clear or pale yellow by 5 days after birth.  Wetting 6-8 diapers every 24 hours as your baby continues to grow and develop.  At least 3 stools in a 24-hour period by age 5 days. The stool should be soft and yellow.  At least 3 stools in a 24-hour period by age 7 days. The stool should be seedy and yellow.  No loss of weight greater than 10% of birth weight during the first 3 days of age.  Average  weight gain of 4-7 ounces (113-198 g) per week after age 4 days.  Consistent daily weight gain by age 5 days, without weight loss after the age of 2 weeks.  After a feeding, your baby may spit up a small amount. This is common. Breastfeeding frequency and duration Frequent feeding will help you make more milk and can prevent sore nipples and breast engorgement. Breastfeed when you feel the need to reduce the fullness of your breasts or when your baby shows signs of hunger. This is called "breastfeeding on demand." Avoid introducing a pacifier to your baby while you are working to establish breastfeeding (the first 4-6 weeks after your baby is born). After this time you may choose to use a pacifier. Research has shown that pacifier use during the first year of a baby's life decreases the risk of sudden infant death syndrome (SIDS). Allow your baby to feed on each breast as long as he or she wants. Breastfeed until your baby is finished feeding. When your baby unlatches or falls asleep while feeding from the first breast, offer the second breast. Because newborns are often sleepy in the first few weeks of life, you may need to awaken your baby to get him or her to feed. Breastfeeding times will vary from baby to baby. However, the following rules can serve as a guide to help you ensure that your baby is properly fed:  Newborns (babies 4 weeks of age or younger) may breastfeed every 1-3 hours.  Newborns should not go longer than 3 hours during the day or 5 hours during the night without breastfeeding.  You should breastfeed your baby a minimum of 8 times in a 24-hour period until you begin to introduce solid foods to your   baby at around 6 months of age.  Breast milk pumping Pumping and storing breast milk allows you to ensure that your baby is exclusively fed your breast milk, even at times when you are unable to breastfeed. This is especially important if you are going back to work while you are still  breastfeeding or when you are not able to be present during feedings. Your lactation consultant can give you guidelines on how long it is safe to store breast milk. A breast pump is a machine that allows you to pump milk from your breast into a sterile bottle. The pumped breast milk can then be stored in a refrigerator or freezer. Some breast pumps are operated by hand, while others use electricity. Ask your lactation consultant which type will work best for you. Breast pumps can be purchased, but some hospitals and breastfeeding support groups lease breast pumps on a monthly basis. A lactation consultant can teach you how to hand express breast milk, if you prefer not to use a pump. Caring for your breasts while you breastfeed Nipples can become dry, cracked, and sore while breastfeeding. The following recommendations can help keep your breasts moisturized and healthy:  Avoid using soap on your nipples.  Wear a supportive bra. Although not required, special nursing bras and tank tops are designed to allow access to your breasts for breastfeeding without taking off your entire bra or top. Avoid wearing underwire-style bras or extremely tight bras.  Air dry your nipples for 3-4minutes after each feeding.  Use only cotton bra pads to absorb leaked breast milk. Leaking of breast milk between feedings is normal.  Use lanolin on your nipples after breastfeeding. Lanolin helps to maintain your skin's normal moisture barrier. If you use pure lanolin, you do not need to wash it off before feeding your baby again. Pure lanolin is not toxic to your baby. You may also hand express a few drops of breast milk and gently massage that milk into your nipples and allow the milk to air dry.  In the first few weeks after giving birth, some women experience extremely full breasts (engorgement). Engorgement can make your breasts feel heavy, warm, and tender to the touch. Engorgement peaks within 3-5 days after you give  birth. The following recommendations can help ease engorgement:  Completely empty your breasts while breastfeeding or pumping. You may want to start by applying warm, moist heat (in the shower or with warm water-soaked hand towels) just before feeding or pumping. This increases circulation and helps the milk flow. If your baby does not completely empty your breasts while breastfeeding, pump any extra milk after he or she is finished.  Wear a snug bra (nursing or regular) or tank top for 1-2 days to signal your body to slightly decrease milk production.  Apply ice packs to your breasts, unless this is too uncomfortable for you.  Make sure that your baby is latched on and positioned properly while breastfeeding.  If engorgement persists after 48 hours of following these recommendations, contact your health care provider or a lactation consultant. Overall health care recommendations while breastfeeding  Eat healthy foods. Alternate between meals and snacks, eating 3 of each per day. Because what you eat affects your breast milk, some of the foods may make your baby more irritable than usual. Avoid eating these foods if you are sure that they are negatively affecting your baby.  Drink milk, fruit juice, and water to satisfy your thirst (about 10 glasses a day).    Rest often, relax, and continue to take your prenatal vitamins to prevent fatigue, stress, and anemia.  Continue breast self-awareness checks.  Avoid chewing and smoking tobacco. Chemicals from cigarettes that pass into breast milk and exposure to secondhand smoke may harm your baby.  Avoid alcohol and drug use, including marijuana. Some medicines that may be harmful to your baby can pass through breast milk. It is important to ask your health care provider before taking any medicine, including all over-the-counter and prescription medicine as well as vitamin and herbal supplements. It is possible to become pregnant while breastfeeding.  If birth control is desired, ask your health care provider about options that will be safe for your baby. Contact a health care provider if:  You feel like you want to stop breastfeeding or have become frustrated with breastfeeding.  You have painful breasts or nipples.  Your nipples are cracked or bleeding.  Your breasts are red, tender, or warm.  You have a swollen area on either breast.  You have a fever or chills.  You have nausea or vomiting.  You have drainage other than breast milk from your nipples.  Your breasts do not become full before feedings by the fifth day after you give birth.  You feel sad and depressed.  Your baby is too sleepy to eat well.  Your baby is having trouble sleeping.  Your baby is wetting less than 3 diapers in a 24-hour period.  Your baby has less than 3 stools in a 24-hour period.  Your baby's skin or the white part of his or her eyes becomes yellow.  Your baby is not gaining weight by 5 days of age. Get help right away if:  Your baby is overly tired (lethargic) and does not want to wake up and feed.  Your baby develops an unexplained fever. This information is not intended to replace advice given to you by your health care provider. Make sure you discuss any questions you have with your health care provider. Document Released: 12/06/2005 Document Revised: 05/19/2016 Document Reviewed: 05/30/2013 [employer] Interactive Patient Education  2017 [employer]  

## 2017-04-05 NOTE — Progress Notes (Signed)
Breastfeeding tip reviewed

## 2017-04-05 NOTE — Progress Notes (Signed)
   PRENATAL VISIT NOTE  Subjective:  Jenna Patterson is a 36 y.o. G3P1011 at [redacted]w[redacted]d being seen today for ongoing prenatal care.  She is currently monitored for the following issues for this high-risk pregnancy and has Supervision of high-risk pregnancy; Advanced maternal age in multigravida; Hypothyroid in pregnancy, antepartum; Hypothyroid; Previous cesarean delivery, antepartum; Mitral valve regurgitation; Ventricular hypertrophy; Group B Streptococcus urinary tract infection affecting pregnancy, antepartum; ASCUS with positive high risk HPV cervical; and Vitamin D deficiency on her problem list.  Patient reports no complaints.  Contractions: Not present. Vag. Bleeding: None.  Movement: Present. Denies leaking of fluid.   The following portions of the patient's history were reviewed and updated as appropriate: allergies, current medications, past family history, past medical history, past social history, past surgical history and problem list. Problem list updated.  Objective:   Vitals:   04/05/17 0800  BP: 99/66  Pulse: 85  Weight: 198 lb 4.8 oz (89.9 kg)    Fetal Status: Fetal Heart Rate (bpm): 147   Movement: Present     General:  Alert, oriented and cooperative. Patient is in no acute distress.  Skin: Skin is warm and dry. No rash noted.   Cardiovascular: Normal heart rate noted  Respiratory: Normal respiratory effort, no problems with respiration noted  Abdomen: Soft, gravid, appropriate for gestational age. Pain/Pressure: Absent     Pelvic:  Cervical exam deferred        Extremities: Normal range of motion.  Edema: None  Mental Status: Normal mood and affect. Normal behavior. Normal judgment and thought content.   Assessment and Plan:  Pregnancy: G3P1011 at [redacted]w[redacted]d  1. Hypothyroid in pregnancy, antepartum Continue synthroid Repeat TSH with 28 wk labs  2. Supervision of high risk pregnancy in second trimester - AFP, Serum, Open Spina Bifida  3. Elderly multigravida in  second trimester Normal NIPS  4. Previous cesarean delivery, antepartum Desires RCS  5. ASCUS with positive high risk HPV cervical Declines colposcopy today will get it postpartum  6. Vitamin D deficiency Replete orally - calcium-vitamin D (OSCAL 500/200 D-3) 500-200 MG-UNIT tablet; Take 1 tablet by mouth 2 (two) times daily.  Dispense: 60 tablet; Refill: 3  7. Heart Disease MVR and ventriculomegaly To call and re-schedule cards visit.  Preterm labor symptoms and general obstetric precautions including but not limited to vaginal bleeding, contractions, leaking of fluid and fetal movement were reviewed in detail with the patient. Please refer to After Visit Summary for other counseling recommendations.  Return in 3 weeks (on 04/26/2017) for [facility], needs MD.   Donnamae Jude, MD

## 2017-04-07 ENCOUNTER — Ambulatory Visit (HOSPITAL_COMMUNITY): Payer: Medicaid Other

## 2017-04-08 ENCOUNTER — Encounter (HOSPITAL_COMMUNITY): Payer: Self-pay

## 2017-04-08 ENCOUNTER — Ambulatory Visit (HOSPITAL_COMMUNITY)
Admission: RE | Admit: 2017-04-08 | Discharge: 2017-04-08 | Disposition: A | Discharge status: Home / Self Care | Source: Ambulatory Visit | Attending: Family Medicine | Admitting: Family Medicine

## 2017-04-08 DIAGNOSIS — R87810 Cervical high risk human papillomavirus (HPV) DNA test positive: Secondary | ICD-10-CM

## 2017-04-08 DIAGNOSIS — R87610 Atypical squamous cells of undetermined significance on cytologic smear of cervix (ASC-US): Secondary | ICD-10-CM

## 2017-04-08 DIAGNOSIS — O34219 Maternal care for unspecified type scar from previous cesarean delivery: Secondary | ICD-10-CM | POA: Insufficient documentation

## 2017-04-08 DIAGNOSIS — Z3689 Encounter for other specified antenatal screening: Secondary | ICD-10-CM | POA: Diagnosis present

## 2017-04-08 DIAGNOSIS — O09522 Supervision of elderly multigravida, second trimester: Secondary | ICD-10-CM | POA: Diagnosis not present

## 2017-04-08 DIAGNOSIS — O2340 Unspecified infection of urinary tract in pregnancy, unspecified trimester: Secondary | ICD-10-CM

## 2017-04-08 DIAGNOSIS — O09529 Supervision of elderly multigravida, unspecified trimester: Secondary | ICD-10-CM | POA: Diagnosis present

## 2017-04-08 DIAGNOSIS — E039 Hypothyroidism, unspecified: Secondary | ICD-10-CM | POA: Diagnosis not present

## 2017-04-08 DIAGNOSIS — O99280 Endocrine, nutritional and metabolic diseases complicating pregnancy, unspecified trimester: Secondary | ICD-10-CM

## 2017-04-08 DIAGNOSIS — O99282 Endocrine, nutritional and metabolic diseases complicating pregnancy, second trimester: Secondary | ICD-10-CM | POA: Insufficient documentation

## 2017-04-08 DIAGNOSIS — Z3A19 19 weeks gestation of pregnancy: Secondary | ICD-10-CM | POA: Diagnosis not present

## 2017-04-08 DIAGNOSIS — O0992 Supervision of high risk pregnancy, unspecified, second trimester: Secondary | ICD-10-CM

## 2017-04-08 DIAGNOSIS — B951 Streptococcus, group B, as the cause of diseases classified elsewhere: Secondary | ICD-10-CM

## 2017-04-11 ENCOUNTER — Other Ambulatory Visit (HOSPITAL_COMMUNITY): Payer: Self-pay | Admitting: *Deleted

## 2017-04-11 DIAGNOSIS — Z0489 Encounter for examination and observation for other specified reasons: Secondary | ICD-10-CM

## 2017-04-11 DIAGNOSIS — IMO0002 Reserved for concepts with insufficient information to code with codable children: Secondary | ICD-10-CM

## 2017-04-14 LAB — AFP, SERUM, OPEN SPINA BIFIDA
AFP MoM: 0.85
AFP VALUE AFPOSL: 37.5 ng/mL
Gest. Age on Collection Date: 19.5 weeks
Maternal Age At EDD: 37.7 yr
OSBR Risk 1 IN: 10000
Test Results:: NEGATIVE
WEIGHT: 198 [lb_av]

## 2017-04-27 ENCOUNTER — Ambulatory Visit (INDEPENDENT_AMBULATORY_CARE_PROVIDER_SITE_OTHER): Admitting: Obstetrics and Gynecology

## 2017-04-27 VITALS — BP 113/61 | HR 81 | Wt 210.4 lb

## 2017-04-27 DIAGNOSIS — O2342 Unspecified infection of urinary tract in pregnancy, second trimester: Secondary | ICD-10-CM

## 2017-04-27 DIAGNOSIS — E039 Hypothyroidism, unspecified: Secondary | ICD-10-CM

## 2017-04-27 DIAGNOSIS — O09522 Supervision of elderly multigravida, second trimester: Secondary | ICD-10-CM

## 2017-04-27 DIAGNOSIS — O34219 Maternal care for unspecified type scar from previous cesarean delivery: Secondary | ICD-10-CM

## 2017-04-27 DIAGNOSIS — B951 Streptococcus, group B, as the cause of diseases classified elsewhere: Secondary | ICD-10-CM

## 2017-04-27 DIAGNOSIS — O99280 Endocrine, nutritional and metabolic diseases complicating pregnancy, unspecified trimester: Secondary | ICD-10-CM

## 2017-04-27 DIAGNOSIS — O2340 Unspecified infection of urinary tract in pregnancy, unspecified trimester: Principal | ICD-10-CM

## 2017-04-27 DIAGNOSIS — O99282 Endocrine, nutritional and metabolic diseases complicating pregnancy, second trimester: Secondary | ICD-10-CM

## 2017-04-27 DIAGNOSIS — O0992 Supervision of high risk pregnancy, unspecified, second trimester: Secondary | ICD-10-CM

## 2017-04-27 NOTE — Progress Notes (Signed)
   PRENATAL VISIT NOTE  Subjective:  Jenna Patterson is a 36 y.o. G3P1011 at [redacted]w[redacted]d being seen today for ongoing prenatal care.  She is currently monitored for the following issues for this high-risk pregnancy and has Supervision of high-risk pregnancy; Advanced maternal age in multigravida; Hypothyroid in pregnancy, antepartum; Hypothyroid; Previous cesarean delivery, antepartum; Mitral valve regurgitation; Ventricular hypertrophy; Group B Streptococcus urinary tract infection affecting pregnancy, antepartum; ASCUS with positive high risk HPV cervical; and Vitamin D deficiency on her problem list.  Patient reports no complaints.  Contractions: Not present. Vag. Bleeding: None.  Movement: Present. Denies leaking of fluid.   The following portions of the patient's history were reviewed and updated as appropriate: allergies, current medications, past family history, past medical history, past social history, past surgical history and problem list. Problem list updated.  Objective:   Vitals:   04/27/17 0855  BP: 113/61  Pulse: 81  Weight: 210 lb 6.4 oz (95.4 kg)    Fetal Status: Fetal Heart Rate (bpm): 140 Fundal Height: 22 cm Movement: Present     General:  Alert, oriented and cooperative. Patient is in no acute distress.  Skin: Skin is warm and dry. No rash noted.   Cardiovascular: Normal heart rate noted  Respiratory: Normal respiratory effort, no problems with respiration noted  Abdomen: Soft, gravid, appropriate for gestational age. Pain/Pressure: Absent     Pelvic:  Cervical exam deferred        Extremities: Normal range of motion.  Edema: Trace  Mental Status: Normal mood and affect. Normal behavior. Normal judgment and thought content.   Assessment and Plan:  Pregnancy: G3P1011 at [redacted]w[redacted]d  1. Group B Streptococcus urinary tract infection affecting pregnancy, antepartum Will provide prophylaxis if laboring  2. Hypothyroid in pregnancy, antepartum Continue Synthroid Check TSH  today - TSH  3. Previous cesarean delivery, antepartum Patient desires RCS if no SOL She is also considering BTL  4. Elderly multigravida in second trimester Normal NIPS  5. Supervision of high risk pregnancy in second trimester Patient is doing well without complaints Follow up anatomy ultrasound 5/18  Preterm labor symptoms and general obstetric precautions including but not limited to vaginal bleeding, contractions, leaking of fluid and fetal movement were reviewed in detail with the patient. Please refer to After Visit Summary for other counseling recommendations.  No Follow-up on file.   Cynithia Hakimi, Vickii Chafe, MD

## 2017-04-28 LAB — TSH: TSH: 4.81 u[IU]/mL — AB (ref 0.450–4.500)

## 2017-05-05 ENCOUNTER — Other Ambulatory Visit: Payer: Self-pay | Admitting: Obstetrics and Gynecology

## 2017-05-05 MED ORDER — LEVOTHYROXINE SODIUM 175 MCG PO TABS: 175.0000 ug | ORAL_TABLET | Freq: Every day | ORAL | 4 refills | Status: DC

## 2017-05-06 ENCOUNTER — Ambulatory Visit (HOSPITAL_COMMUNITY)
Admission: RE | Admit: 2017-05-06 | Discharge: 2017-05-06 | Disposition: A | Discharge status: Home / Self Care | Source: Ambulatory Visit | Attending: Family Medicine | Admitting: Family Medicine

## 2017-05-06 ENCOUNTER — Encounter: Payer: Self-pay | Admitting: General Practice

## 2017-05-06 ENCOUNTER — Encounter (HOSPITAL_COMMUNITY): Payer: Self-pay

## 2017-05-06 DIAGNOSIS — O0992 Supervision of high risk pregnancy, unspecified, second trimester: Secondary | ICD-10-CM

## 2017-05-06 DIAGNOSIS — B951 Streptococcus, group B, as the cause of diseases classified elsewhere: Secondary | ICD-10-CM

## 2017-05-06 DIAGNOSIS — O99280 Endocrine, nutritional and metabolic diseases complicating pregnancy, unspecified trimester: Secondary | ICD-10-CM

## 2017-05-06 DIAGNOSIS — R87810 Cervical high risk human papillomavirus (HPV) DNA test positive: Secondary | ICD-10-CM

## 2017-05-06 DIAGNOSIS — IMO0002 Reserved for concepts with insufficient information to code with codable children: Secondary | ICD-10-CM

## 2017-05-06 DIAGNOSIS — Z3A23 23 weeks gestation of pregnancy: Secondary | ICD-10-CM | POA: Diagnosis not present

## 2017-05-06 DIAGNOSIS — O34219 Maternal care for unspecified type scar from previous cesarean delivery: Secondary | ICD-10-CM

## 2017-05-06 DIAGNOSIS — O09522 Supervision of elderly multigravida, second trimester: Secondary | ICD-10-CM

## 2017-05-06 DIAGNOSIS — Z048 Encounter for examination and observation for other specified reasons: Secondary | ICD-10-CM | POA: Insufficient documentation

## 2017-05-06 DIAGNOSIS — R87610 Atypical squamous cells of undetermined significance on cytologic smear of cervix (ASC-US): Secondary | ICD-10-CM

## 2017-05-06 DIAGNOSIS — O2340 Unspecified infection of urinary tract in pregnancy, unspecified trimester: Secondary | ICD-10-CM

## 2017-05-06 DIAGNOSIS — E039 Hypothyroidism, unspecified: Secondary | ICD-10-CM

## 2017-05-06 DIAGNOSIS — Z0489 Encounter for examination and observation for other specified reasons: Secondary | ICD-10-CM

## 2017-05-26 ENCOUNTER — Ambulatory Visit (INDEPENDENT_AMBULATORY_CARE_PROVIDER_SITE_OTHER): Admitting: Obstetrics and Gynecology

## 2017-05-26 VITALS — BP 102/70 | HR 92 | Wt 222.2 lb

## 2017-05-26 DIAGNOSIS — O09522 Supervision of elderly multigravida, second trimester: Secondary | ICD-10-CM

## 2017-05-26 DIAGNOSIS — Z23 Encounter for immunization: Secondary | ICD-10-CM | POA: Diagnosis not present

## 2017-05-26 DIAGNOSIS — O34219 Maternal care for unspecified type scar from previous cesarean delivery: Secondary | ICD-10-CM

## 2017-05-26 DIAGNOSIS — O0992 Supervision of high risk pregnancy, unspecified, second trimester: Secondary | ICD-10-CM

## 2017-05-26 DIAGNOSIS — O2340 Unspecified infection of urinary tract in pregnancy, unspecified trimester: Secondary | ICD-10-CM

## 2017-05-26 DIAGNOSIS — B951 Streptococcus, group B, as the cause of diseases classified elsewhere: Secondary | ICD-10-CM

## 2017-05-26 DIAGNOSIS — O2342 Unspecified infection of urinary tract in pregnancy, second trimester: Secondary | ICD-10-CM

## 2017-05-26 MED ORDER — TETANUS-DIPHTH-ACELL PERTUSSIS 5-2.5-18.5 LF-MCG/0.5 IM SUSP
0.5000 mL | Freq: Once | INTRAMUSCULAR | Status: AC
  Administered 2017-05-26: 0.5 mL via INTRAMUSCULAR

## 2017-05-26 MED ORDER — BREAST PUMP MISC: 0 refills | Status: DC

## 2017-05-26 NOTE — Addendum Note (Signed)
Addended by: Verdell Carmine on: 05/26/2017 05:08 PM   Modules accepted: Orders

## 2017-05-26 NOTE — Progress Notes (Signed)
   PRENATAL VISIT NOTE  Subjective:  Jenna Patterson is a 36 y.o. G3P1011 at [redacted]w[redacted]d being seen today for ongoing prenatal care.  She is currently monitored for the following issues for this high-risk pregnancy and has Supervision of high-risk pregnancy; Advanced maternal age in multigravida; Hypothyroid in pregnancy, antepartum; Hypothyroid; Previous cesarean delivery, antepartum; Mitral valve regurgitation; Ventricular hypertrophy; Group B Streptococcus urinary tract infection affecting pregnancy, antepartum; ASCUS with positive high risk HPV cervical; and Vitamin D deficiency on her problem list.  Patient reports no complaints.  Contractions: Not present. Vag. Bleeding: None.  Movement: Present. Denies leaking of fluid.   The following portions of the patient's history were reviewed and updated as appropriate: allergies, current medications, past family history, past medical history, past social history, past surgical history and problem list. Problem list updated.  Objective:   Vitals:   05/26/17 1244  BP: 102/70  Pulse: 92  Weight: 222 lb 3.2 oz (100.8 kg)    Fetal Status: Fetal Heart Rate (bpm): 158 Fundal Height: 26 cm Movement: Present     General:  Alert, oriented and cooperative. Patient is in no acute distress.  Skin: Skin is warm and dry. No rash noted.   Cardiovascular: Normal heart rate noted  Respiratory: Normal respiratory effort, no problems with respiration noted  Abdomen: Soft, gravid, appropriate for gestational age. Pain/Pressure: Absent     Pelvic:  Cervical exam deferred        Extremities: Normal range of motion.  Edema: Trace  Mental Status: Normal mood and affect. Normal behavior. Normal judgment and thought content.   Assessment and Plan:  Pregnancy: G3P1011 at [redacted]w[redacted]d  1. Supervision of high risk pregnancy in second trimester Patient is doing well Synthroid recently increased to 175 mcg. Will check TSH with third trimester labs Third trimester labs next  visit as patient is not fasting today. TDap today  2. Previous cesarean delivery, antepartum Will be scheduled for repeat  3. Elderly multigravida in second trimester Normal NIPS  4. Group B Streptococcus urinary tract infection affecting pregnancy, antepartum Will provide prophylaxis in labor  Preterm labor symptoms and general obstetric precautions including but not limited to vaginal bleeding, contractions, leaking of fluid and fetal movement were reviewed in detail with the patient. Please refer to After Visit Summary for other counseling recommendations.  Return in about 2 weeks (around 06/09/2017) for ROB, 2 hr glucola next visit.   Mora Bellman, MD

## 2017-05-26 NOTE — Progress Notes (Signed)
Tdap given today 28 wk packet given

## 2017-05-30 ENCOUNTER — Other Ambulatory Visit

## 2017-05-30 DIAGNOSIS — O09522 Supervision of elderly multigravida, second trimester: Secondary | ICD-10-CM

## 2017-05-31 LAB — CBC
Hematocrit: 36.2 % (ref 34.0–46.6)
Hemoglobin: 12.1 g/dL (ref 11.1–15.9)
MCH: 30.3 pg (ref 26.6–33.0)
MCHC: 33.4 g/dL (ref 31.5–35.7)
MCV: 91 fL (ref 79–97)
PLATELETS: 328 10*3/uL (ref 150–379)
RBC: 3.99 x10E6/uL (ref 3.77–5.28)
RDW: 12.8 % (ref 12.3–15.4)
WBC: 11.5 10*3/uL — ABNORMAL HIGH (ref 3.4–10.8)

## 2017-05-31 LAB — RPR: RPR Ser Ql: NONREACTIVE

## 2017-05-31 LAB — GLUCOSE TOLERANCE, 2 HOURS W/ 1HR
GLUCOSE, FASTING: 91 mg/dL (ref 65–91)
Glucose, 1 hour: 133 mg/dL (ref 65–179)
Glucose, 2 hour: 84 mg/dL (ref 65–152)

## 2017-05-31 LAB — HIV ANTIBODY (ROUTINE TESTING W REFLEX): HIV Screen 4th Generation wRfx: NONREACTIVE

## 2017-06-13 ENCOUNTER — Ambulatory Visit (INDEPENDENT_AMBULATORY_CARE_PROVIDER_SITE_OTHER): Admitting: Family Medicine

## 2017-06-13 VITALS — BP 112/56 | HR 101 | Wt 228.2 lb

## 2017-06-13 DIAGNOSIS — I340 Nonrheumatic mitral (valve) insufficiency: Secondary | ICD-10-CM

## 2017-06-13 DIAGNOSIS — E559 Vitamin D deficiency, unspecified: Secondary | ICD-10-CM

## 2017-06-13 DIAGNOSIS — O99280 Endocrine, nutritional and metabolic diseases complicating pregnancy, unspecified trimester: Secondary | ICD-10-CM

## 2017-06-13 DIAGNOSIS — I517 Cardiomegaly: Secondary | ICD-10-CM

## 2017-06-13 DIAGNOSIS — E039 Hypothyroidism, unspecified: Secondary | ICD-10-CM

## 2017-06-13 DIAGNOSIS — O09523 Supervision of elderly multigravida, third trimester: Secondary | ICD-10-CM

## 2017-06-13 DIAGNOSIS — O34219 Maternal care for unspecified type scar from previous cesarean delivery: Secondary | ICD-10-CM

## 2017-06-13 DIAGNOSIS — O99283 Endocrine, nutritional and metabolic diseases complicating pregnancy, third trimester: Secondary | ICD-10-CM

## 2017-06-13 DIAGNOSIS — O0993 Supervision of high risk pregnancy, unspecified, third trimester: Secondary | ICD-10-CM

## 2017-06-13 MED ORDER — BREAST PUMP MISC: 0 refills | Status: DC

## 2017-06-13 NOTE — Progress Notes (Signed)
   PRENATAL VISIT NOTE  Subjective:  Jenna Patterson is a 36 y.o. G3P1011 at [redacted]w[redacted]d being seen today for ongoing prenatal care.  She is currently monitored for the following issues for this high-risk pregnancy and has Supervision of high-risk pregnancy; Advanced maternal age in multigravida; Hypothyroid in pregnancy, antepartum; Hypothyroid; Previous cesarean delivery, antepartum; Mitral valve regurgitation; Ventricular hypertrophy; Group B Streptococcus urinary tract infection affecting pregnancy, antepartum; ASCUS with positive high risk HPV cervical; and Vitamin D deficiency on her problem list.  Patient reports no complaints.  Contractions: Irregular. Vag. Bleeding: None.  Movement: Present. Denies leaking of fluid.   The following portions of the patient's history were reviewed and updated as appropriate: allergies, current medications, past family history, past medical history, past social history, past surgical history and problem list. Problem list updated.  Objective:   Vitals:   06/13/17 0957  BP: (!) 112/56  Pulse: (!) 101  Weight: 228 lb 3.2 oz (103.5 kg)    Fetal Status: Fetal Heart Rate (bpm): 148 Fundal Height: 28 cm Movement: Present     General:  Alert, oriented and cooperative. Patient is in no acute distress.  Skin: Skin is warm and dry. No rash noted.   Cardiovascular: Normal heart rate noted  Respiratory: Normal respiratory effort, no problems with respiration noted  Abdomen: Soft, gravid, appropriate for gestational age. Pain/Pressure: Present     Pelvic:  Cervical exam deferred        Extremities: Normal range of motion.  Edema: Trace  Mental Status: Normal mood and affect. Normal behavior. Normal judgment and thought content.   Assessment and Plan:  Pregnancy: G3P1011 at [redacted]w[redacted]d  1. Supervision of high risk pregnancy in third trimester Continue routine prenatal care. Normal 28 wk labs - Misc. Devices (BREAST PUMP) MISC; Dispense one breast pump for patient   Dispense: 1 each; Refill: 0  2. Elderly multigravida in third trimester nml NIPS  3. Previous cesarean delivery, antepartum Discussed risk of TOLAC vs RCS and patient desires TOLAC--consent given and signed.  4. Vitamin D deficiency Re-check level - Vitamin D (25 hydroxy)  5. Hypothyroid in pregnancy, antepartum Repeat level - TSH  6. Ventricular hypertrophy To get to cards and possible ECHO--will need prior to delivery due to anesthesia, fluid shifts with delivery and formal recommendations.  7. Non-rheumatic mitral regurgitation See plan for # 6  Preterm labor symptoms and general obstetric precautions including but not limited to vaginal bleeding, contractions, leaking of fluid and fetal movement were reviewed in detail with the patient. Please refer to After Visit Summary for other counseling recommendations.  Return in 2 weeks (on 06/27/2017).   Donnamae Jude, MD

## 2017-06-13 NOTE — Patient Instructions (Signed)
 Third Trimester of Pregnancy The third trimester is from week 28 through week 40 (months 7 through 9). The third trimester is a time when the unborn baby (fetus) is growing rapidly. At the end of the ninth month, the fetus is about 20 inches in length and weighs 6-10 pounds. Body changes during your third trimester Your body will continue to go through many changes during pregnancy. The changes vary from woman to woman. During the third trimester:  Your weight will continue to increase. You can expect to gain 25-35 pounds (11-16 kg) by the end of the pregnancy.  You may begin to get stretch marks on your hips, abdomen, and breasts.  You may urinate more often because the fetus is moving lower into your pelvis and pressing on your bladder.  You may develop or continue to have heartburn. This is caused by increased hormones that slow down muscles in the digestive tract.  You may develop or continue to have constipation because increased hormones slow digestion and cause the muscles that push waste through your intestines to relax.  You may develop hemorrhoids. These are swollen veins (varicose veins) in the rectum that can itch or be painful.  You may develop swollen, bulging veins (varicose veins) in your legs.  You may have increased body aches in the pelvis, back, or thighs. This is due to weight gain and increased hormones that are relaxing your joints.  You may have changes in your hair. These can include thickening of your hair, rapid growth, and changes in texture. Some women also have hair loss during or after pregnancy, or hair that feels dry or thin. Your hair will most likely return to normal after your baby is born.  Your breasts will continue to grow and they will continue to become tender. A yellow fluid (colostrum) may leak from your breasts. This is the first milk you are producing for your baby.  Your belly button may stick out.  You may notice more swelling in your  hands, face, or ankles.  You may have increased tingling or numbness in your hands, arms, and legs. The skin on your belly may also feel numb.  You may feel short of breath because of your expanding uterus.  You may have more problems sleeping. This can be caused by the size of your belly, increased need to urinate, and an increase in your body's metabolism.  You may notice the fetus "dropping," or moving lower in your abdomen (lightening).  You may have increased vaginal discharge.  You may notice your joints feel loose and you may have pain around your pelvic bone.  What to expect at prenatal visits You will have prenatal exams every 2 weeks until week 36. Then you will have weekly prenatal exams. During a routine prenatal visit:  You will be weighed to make sure you and the baby are growing normally.  Your blood pressure will be taken.  Your abdomen will be measured to track your baby's growth.  The fetal heartbeat will be listened to.  Any test results from the previous visit will be discussed.  You may have a cervical check near your due date to see if your cervix has softened or thinned (effaced).  You will be tested for Group B streptococcus. This happens between 35 and 37 weeks.  Your health care provider may ask you:  What your birth plan is.  How you are feeling.  If you are feeling the baby move.  If you have   had any abnormal symptoms, such as leaking fluid, bleeding, severe headaches, or abdominal cramping.  If you are using any tobacco products, including cigarettes, chewing tobacco, and electronic cigarettes.  If you have any questions.  Other tests or screenings that may be performed during your third trimester include:  Blood tests that check for low iron levels (anemia).  Fetal testing to check the health, activity level, and growth of the fetus. Testing is done if you have certain medical conditions or if there are problems during the  pregnancy.  Nonstress test (NST). This test checks the health of your baby to make sure there are no signs of problems, such as the baby not getting enough oxygen. During this test, a belt is placed around your belly. The baby is made to move, and its heart rate is monitored during movement.  What is false labor? False labor is a condition in which you feel small, irregular tightenings of the muscles in the womb (contractions) that usually go away with rest, changing position, or drinking water. These are called Braxton Hicks contractions. Contractions may last for hours, days, or even weeks before true labor sets in. If contractions come at regular intervals, become more frequent, increase in intensity, or become painful, you should see your health care provider. What are the signs of labor?  Abdominal cramps.  Regular contractions that start at 10 minutes apart and become stronger and more frequent with time.  Contractions that start on the top of the uterus and spread down to the lower abdomen and back.  Increased pelvic pressure and dull back pain.  A watery or bloody mucus discharge that comes from the vagina.  Leaking of amniotic fluid. This is also known as your "water breaking." It could be a slow trickle or a gush. Let your health care provider know if it has a color or strange odor. If you have any of these signs, call your health care provider right away, even if it is before your due date. Follow these instructions at home: Medicines  Follow your health care provider's instructions regarding medicine use. Specific medicines may be either safe or unsafe to take during pregnancy.  Take a prenatal vitamin that contains at least 600 micrograms (mcg) of folic acid.  If you develop constipation, try taking a stool softener if your health care provider approves. Eating and drinking  Eat a balanced diet that includes fresh fruits and vegetables, whole grains, good sources of protein  such as meat, eggs, or tofu, and low-fat dairy. Your health care provider will help you determine the amount of weight gain that is right for you.  Avoid raw meat and uncooked cheese. These carry germs that can cause birth defects in the baby.  If you have low calcium intake from food, talk to your health care provider about whether you should take a daily calcium supplement.  Eat four or five small meals rather than three large meals a day.  Limit foods that are high in fat and processed sugars, such as fried and sweet foods.  To prevent constipation: ? Drink enough fluid to keep your urine clear or pale yellow. ? Eat foods that are high in fiber, such as fresh fruits and vegetables, whole grains, and beans. Activity  Exercise only as directed by your health care provider. Most women can continue their usual exercise routine during pregnancy. Try to exercise for 30 minutes at least 5 days a week. Stop exercising if you experience uterine contractions.  Avoid   heavy lifting.  Do not exercise in extreme heat or humidity, or at high altitudes.  Wear low-heel, comfortable shoes.  Practice good posture.  You may continue to have sex unless your health care provider tells you otherwise. Relieving pain and discomfort  Take frequent breaks and rest with your legs elevated if you have leg cramps or low back pain.  Take warm sitz baths to soothe any pain or discomfort caused by hemorrhoids. Use hemorrhoid cream if your health care provider approves.  Wear a good support bra to prevent discomfort from breast tenderness.  If you develop varicose veins: ? Wear support pantyhose or compression stockings as told by your healthcare provider. ? Elevate your feet for 15 minutes, 3-4 times a day. Prenatal care  Write down your questions. Take them to your prenatal visits.  Keep all your prenatal visits as told by your health care provider. This is important. Safety  Wear your seat belt at  all times when driving.  Make a list of emergency phone numbers, including numbers for family, friends, the hospital, and police and fire departments. General instructions  Avoid cat litter boxes and soil used by cats. These carry germs that can cause birth defects in the baby. If you have a cat, ask someone to clean the litter box for you.  Do not travel far distances unless it is absolutely necessary and only with the approval of your health care provider.  Do not use hot tubs, steam rooms, or saunas.  Do not drink alcohol.  Do not use any products that contain nicotine or tobacco, such as cigarettes and e-cigarettes. If you need help quitting, ask your health care provider.  Do not use any medicinal herbs or unprescribed drugs. These chemicals affect the formation and growth of the baby.  Do not douche or use tampons or scented sanitary pads.  Do not cross your legs for long periods of time.  To prepare for the arrival of your baby: ? Take prenatal classes to understand, practice, and ask questions about labor and delivery. ? Make a trial run to the hospital. ? Visit the hospital and tour the maternity area. ? Arrange for maternity or paternity leave through employers. ? Arrange for family and friends to take care of pets while you are in the hospital. ? Purchase a rear-facing car seat and make sure you know how to install it in your car. ? Pack your hospital bag. ? Prepare the baby's nursery. Make sure to remove all pillows and stuffed animals from the baby's crib to prevent suffocation.  Visit your dentist if you have not gone during your pregnancy. Use a soft toothbrush to brush your teeth and be gentle when you floss. Contact a health care provider if:  You are unsure if you are in labor or if your water has broken.  You become dizzy.  You have mild pelvic cramps, pelvic pressure, or nagging pain in your abdominal area.  You have lower back pain.  You have persistent  nausea, vomiting, or diarrhea.  You have an unusual or bad smelling vaginal discharge.  You have pain when you urinate. Get help right away if:  Your water breaks before 37 weeks.  You have regular contractions less than 5 minutes apart before 37 weeks.  You have a fever.  You are leaking fluid from your vagina.  You have spotting or bleeding from your vagina.  You have severe abdominal pain or cramping.  You have rapid weight loss or weight   gain.  You have shortness of breath with chest pain.  You notice sudden or extreme swelling of your face, hands, ankles, feet, or legs.  Your baby makes fewer than 10 movements in 2 hours.  You have severe headaches that do not go away when you take medicine.  You have vision changes. Summary  The third trimester is from week 28 through week 40, months 7 through 9. The third trimester is a time when the unborn baby (fetus) is growing rapidly.  During the third trimester, your discomfort may increase as you and your baby continue to gain weight. You may have abdominal, leg, and back pain, sleeping problems, and an increased need to urinate.  During the third trimester your breasts will keep growing and they will continue to become tender. A yellow fluid (colostrum) may leak from your breasts. This is the first milk you are producing for your baby.  False labor is a condition in which you feel small, irregular tightenings of the muscles in the womb (contractions) that eventually go away. These are called Braxton Hicks contractions. Contractions may last for hours, days, or even weeks before true labor sets in.  Signs of labor can include: abdominal cramps; regular contractions that start at 10 minutes apart and become stronger and more frequent with time; watery or bloody mucus discharge that comes from the vagina; increased pelvic pressure and dull back pain; and leaking of amniotic fluid. This information is not intended to replace advice  given to you by your health care provider. Make sure you discuss any questions you have with your health care provider. Document Released: 11/30/2001 Document Revised: 05/13/2016 Document Reviewed: 02/06/2013 [employer] Interactive Patient Education  2017 [employer]   Breastfeeding Deciding to breastfeed is one of the best choices you can make for you and your baby. A change in hormones during pregnancy causes your breast tissue to grow and increases the number and size of your milk ducts. These hormones also allow proteins, sugars, and fats from your blood supply to make breast milk in your milk-producing glands. Hormones prevent breast milk from being released before your baby is born as well as prompt milk flow after birth. Once breastfeeding has begun, thoughts of your baby, as well as his or her sucking or crying, can stimulate the release of milk from your milk-producing glands. Benefits of breastfeeding For Your Baby  Your first milk (colostrum) helps your baby's digestive system function better.  There are antibodies in your milk that help your baby fight off infections.  Your baby has a lower incidence of asthma, allergies, and sudden infant death syndrome.  The nutrients in breast milk are better for your baby than infant formulas and are designed uniquely for your baby's needs.  Breast milk improves your baby's brain development.  Your baby is less likely to develop other conditions, such as childhood obesity, asthma, or type 2 diabetes mellitus.  For You  Breastfeeding helps to create a very special bond between you and your baby.  Breastfeeding is convenient. Breast milk is always available at the correct temperature and costs nothing.  Breastfeeding helps to burn calories and helps you lose the weight gained during pregnancy.  Breastfeeding makes your uterus contract to its prepregnancy size faster and slows bleeding (lochia) after you give birth.  Breastfeeding helps  to lower your risk of developing type 2 diabetes mellitus, osteoporosis, and breast or ovarian cancer later in life.  Signs that your baby is hungry Early Signs of Hunger    Increased alertness or activity.  Stretching.  Movement of the head from side to side.  Movement of the head and opening of the mouth when the corner of the mouth or cheek is stroked (rooting).  Increased sucking sounds, smacking lips, cooing, sighing, or squeaking.  Hand-to-mouth movements.  Increased sucking of fingers or hands.  Late Signs of Hunger  Fussing.  Intermittent crying.  Extreme Signs of Hunger Signs of extreme hunger will require calming and consoling before your baby will be able to breastfeed successfully. Do not wait for the following signs of extreme hunger to occur before you initiate breastfeeding:  Restlessness.  A loud, strong cry.  Screaming.  Breastfeeding basics Breastfeeding Initiation  Find a comfortable place to sit or lie down, with your neck and back well supported.  Place a pillow or rolled up blanket under your baby to bring him or her to the level of your breast (if you are seated). Nursing pillows are specially designed to help support your arms and your baby while you breastfeed.  Make sure that your baby's abdomen is facing your abdomen.  Gently massage your breast. With your fingertips, massage from your chest wall toward your nipple in a circular motion. This encourages milk flow. You may need to continue this action during the feeding if your milk flows slowly.  Support your breast with 4 fingers underneath and your thumb above your nipple. Make sure your fingers are well away from your nipple and your baby's mouth.  Stroke your baby's lips gently with your finger or nipple.  When your baby's mouth is open wide enough, quickly bring your baby to your breast, placing your entire nipple and as much of the colored area around your nipple (areola) as possible into  your baby's mouth. ? More areola should be visible above your baby's upper lip than below the lower lip. ? Your baby's tongue should be between his or her lower gum and your breast.  Ensure that your baby's mouth is correctly positioned around your nipple (latched). Your baby's lips should create a seal on your breast and be turned out (everted).  It is common for your baby to suck about 2-3 minutes in order to start the flow of breast milk.  Latching Teaching your baby how to latch on to your breast properly is very important. An improper latch can cause nipple pain and decreased milk supply for you and poor weight gain in your baby. Also, if your baby is not latched onto your nipple properly, he or she may swallow some air during feeding. This can make your baby fussy. Burping your baby when you switch breasts during the feeding can help to get rid of the air. However, teaching your baby to latch on properly is still the best way to prevent fussiness from swallowing air while breastfeeding. Signs that your baby has successfully latched on to your nipple:  Silent tugging or silent sucking, without causing you pain.  Swallowing heard between every 3-4 sucks.  Muscle movement above and in front of his or her ears while sucking.  Signs that your baby has not successfully latched on to nipple:  Sucking sounds or smacking sounds from your baby while breastfeeding.  Nipple pain.  If you think your baby has not latched on correctly, slip your finger into the corner of your baby's mouth to break the suction and place it between your baby's gums. Attempt breastfeeding initiation again. Signs of Successful Breastfeeding Signs from your baby:  A   gradual decrease in the number of sucks or complete cessation of sucking.  Falling asleep.  Relaxation of his or her body.  Retention of a small amount of milk in his or her mouth.  Letting go of your breast by himself or herself.  Signs from  you:  Breasts that have increased in firmness, weight, and size 1-3 hours after feeding.  Breasts that are softer immediately after breastfeeding.  Increased milk volume, as well as a change in milk consistency and color by the fifth day of breastfeeding.  Nipples that are not sore, cracked, or bleeding.  Signs That Your Baby is Getting Enough Milk  Wetting at least 1-2 diapers during the first 24 hours after birth.  Wetting at least 5-6 diapers every 24 hours for the first week after birth. The urine should be clear or pale yellow by 5 days after birth.  Wetting 6-8 diapers every 24 hours as your baby continues to grow and develop.  At least 3 stools in a 24-hour period by age 5 days. The stool should be soft and yellow.  At least 3 stools in a 24-hour period by age 7 days. The stool should be seedy and yellow.  No loss of weight greater than 10% of birth weight during the first 3 days of age.  Average weight gain of 4-7 ounces (113-198 g) per week after age 4 days.  Consistent daily weight gain by age 5 days, without weight loss after the age of 2 weeks.  After a feeding, your baby may spit up a small amount. This is common. Breastfeeding frequency and duration Frequent feeding will help you make more milk and can prevent sore nipples and breast engorgement. Breastfeed when you feel the need to reduce the fullness of your breasts or when your baby shows signs of hunger. This is called "breastfeeding on demand." Avoid introducing a pacifier to your baby while you are working to establish breastfeeding (the first 4-6 weeks after your baby is born). After this time you may choose to use a pacifier. Research has shown that pacifier use during the first year of a baby's life decreases the risk of sudden infant death syndrome (SIDS). Allow your baby to feed on each breast as long as he or she wants. Breastfeed until your baby is finished feeding. When your baby unlatches or falls asleep  while feeding from the first breast, offer the second breast. Because newborns are often sleepy in the first few weeks of life, you may need to awaken your baby to get him or her to feed. Breastfeeding times will vary from baby to baby. However, the following rules can serve as a guide to help you ensure that your baby is properly fed:  Newborns (babies 4 weeks of age or younger) may breastfeed every 1-3 hours.  Newborns should not go longer than 3 hours during the day or 5 hours during the night without breastfeeding.  You should breastfeed your baby a minimum of 8 times in a 24-hour period until you begin to introduce solid foods to your baby at around 6 months of age.  Breast milk pumping Pumping and storing breast milk allows you to ensure that your baby is exclusively fed your breast milk, even at times when you are unable to breastfeed. This is especially important if you are going back to work while you are still breastfeeding or when you are not able to be present during feedings. Your lactation consultant can give you guidelines on how   long it is safe to store breast milk. A breast pump is a machine that allows you to pump milk from your breast into a sterile bottle. The pumped breast milk can then be stored in a refrigerator or freezer. Some breast pumps are operated by hand, while others use electricity. Ask your lactation consultant which type will work best for you. Breast pumps can be purchased, but some hospitals and breastfeeding support groups lease breast pumps on a monthly basis. A lactation consultant can teach you how to hand express breast milk, if you prefer not to use a pump. Caring for your breasts while you breastfeed Nipples can become dry, cracked, and sore while breastfeeding. The following recommendations can help keep your breasts moisturized and healthy:  Avoid using soap on your nipples.  Wear a supportive bra. Although not required, special nursing bras and tank  tops are designed to allow access to your breasts for breastfeeding without taking off your entire bra or top. Avoid wearing underwire-style bras or extremely tight bras.  Air dry your nipples for 3-4minutes after each feeding.  Use only cotton bra pads to absorb leaked breast milk. Leaking of breast milk between feedings is normal.  Use lanolin on your nipples after breastfeeding. Lanolin helps to maintain your skin's normal moisture barrier. If you use pure lanolin, you do not need to wash it off before feeding your baby again. Pure lanolin is not toxic to your baby. You may also hand express a few drops of breast milk and gently massage that milk into your nipples and allow the milk to air dry.  In the first few weeks after giving birth, some women experience extremely full breasts (engorgement). Engorgement can make your breasts feel heavy, warm, and tender to the touch. Engorgement peaks within 3-5 days after you give birth. The following recommendations can help ease engorgement:  Completely empty your breasts while breastfeeding or pumping. You may want to start by applying warm, moist heat (in the shower or with warm water-soaked hand towels) just before feeding or pumping. This increases circulation and helps the milk flow. If your baby does not completely empty your breasts while breastfeeding, pump any extra milk after he or she is finished.  Wear a snug bra (nursing or regular) or tank top for 1-2 days to signal your body to slightly decrease milk production.  Apply ice packs to your breasts, unless this is too uncomfortable for you.  Make sure that your baby is latched on and positioned properly while breastfeeding.  If engorgement persists after 48 hours of following these recommendations, contact your health care provider or a lactation consultant. Overall health care recommendations while breastfeeding  Eat healthy foods. Alternate between meals and snacks, eating 3 of each per  day. Because what you eat affects your breast milk, some of the foods may make your baby more irritable than usual. Avoid eating these foods if you are sure that they are negatively affecting your baby.  Drink milk, fruit juice, and water to satisfy your thirst (about 10 glasses a day).  Rest often, relax, and continue to take your prenatal vitamins to prevent fatigue, stress, and anemia.  Continue breast self-awareness checks.  Avoid chewing and smoking tobacco. Chemicals from cigarettes that pass into breast milk and exposure to secondhand smoke may harm your baby.  Avoid alcohol and drug use, including marijuana. Some medicines that may be harmful to your baby can pass through breast milk. It is important to ask your health care   provider before taking any medicine, including all over-the-counter and prescription medicine as well as vitamin and herbal supplements. It is possible to become pregnant while breastfeeding. If birth control is desired, ask your health care provider about options that will be safe for your baby. Contact a health care provider if:  You feel like you want to stop breastfeeding or have become frustrated with breastfeeding.  You have painful breasts or nipples.  Your nipples are cracked or bleeding.  Your breasts are red, tender, or warm.  You have a swollen area on either breast.  You have a fever or chills.  You have nausea or vomiting.  You have drainage other than breast milk from your nipples.  Your breasts do not become full before feedings by the fifth day after you give birth.  You feel sad and depressed.  Your baby is too sleepy to eat well.  Your baby is having trouble sleeping.  Your baby is wetting less than 3 diapers in a 24-hour period.  Your baby has less than 3 stools in a 24-hour period.  Your baby's skin or the white part of his or her eyes becomes yellow.  Your baby is not gaining weight by 5 days of age. Get help right away  if:  Your baby is overly tired (lethargic) and does not want to wake up and feed.  Your baby develops an unexplained fever. This information is not intended to replace advice given to you by your health care provider. Make sure you discuss any questions you have with your health care provider. Document Released: 12/06/2005 Document Revised: 05/19/2016 Document Reviewed: 05/30/2013 Elsevier Interactive Patient Education  2017 Elsevier Inc.  

## 2017-06-14 LAB — TSH: TSH: 1.48 u[IU]/mL (ref 0.450–4.500)

## 2017-06-14 LAB — VITAMIN D 25 HYDROXY (VIT D DEFICIENCY, FRACTURES): Vit D, 25-Hydroxy: 26.1 ng/mL — ABNORMAL LOW (ref 30.0–100.0)

## 2017-06-21 ENCOUNTER — Ambulatory Visit (INDEPENDENT_AMBULATORY_CARE_PROVIDER_SITE_OTHER): Admitting: Physician Assistant

## 2017-06-21 ENCOUNTER — Encounter: Payer: Self-pay | Admitting: Physician Assistant

## 2017-06-21 VITALS — BP 130/84 | HR 99 | Ht 62.0 in | Wt 229.0 lb

## 2017-06-21 DIAGNOSIS — Z3A29 29 weeks gestation of pregnancy: Secondary | ICD-10-CM | POA: Diagnosis not present

## 2017-06-21 DIAGNOSIS — E039 Hypothyroidism, unspecified: Secondary | ICD-10-CM

## 2017-06-21 DIAGNOSIS — I340 Nonrheumatic mitral (valve) insufficiency: Secondary | ICD-10-CM | POA: Diagnosis not present

## 2017-06-21 DIAGNOSIS — I517 Cardiomegaly: Secondary | ICD-10-CM

## 2017-06-21 NOTE — Patient Instructions (Signed)
Medication Instructions:   No changes  Labwork:   none  Testing/Procedures:  none  Follow-Up:  As needed with cardiology.  (We will contact you if a return workup is indicated based on previous medical records we requested from [facility].)  If you need a refill on your cardiac medications before your next appointment, please call your pharmacy.

## 2017-06-21 NOTE — Progress Notes (Addendum)
Cardiology Office Note    Date:  06/21/2017   ID:  Jenna Patterson, DOB 14-Apr-1981, MRN [id number]  PCP:  Jenna Patterson, [place]  Cardiologist:  Case discussed with Dr. Martinique DOD  (previous Dr. Stanton Patterson with [place] in [facility] 2013)  Chief Complaint  Patient presents with  . New Evaluation    referred by Dr. Jeannetta Patterson, OB/GYN. [redacted] weeks pregnant, h/o LVH and MR   Cardiology service was consulted by Dr. Jeannetta Patterson of Ob/GYN for evaluation of LVH and possible mitral valve regurgitation prior to any procedure that would require anesthesia.  History of Present Illness:  Jenna Patterson is a 36 y.o. female with PMH of hypothyroidism, mitral regurgitation, ventricular hypertrophy. She is currently [redacted] weeks pregnant. According to the patient, she was evaluated in the emergency room in 2013 and the 2016 for palpitation. After her 2013 visit, she was seen by a cardiologist in the [place] at [facility]. According to medical record that was requested, she had echocardiogram performed on 04/18/2012 this showed EF 65-70%, trace mitral regurgitation, mild concentric LVH, otherwise normal bowel function and no pericardial effusion. She also had a stress test on 05/19/2012 this showed EF 65%, normal perfusion. For some reason under her past medical history, there is a coronary artery disease diagnosis, however she never had any prior history of cardiac catheterization, therefore she has never been diagnosed with coronary artery disease based on record that was faxed over. I will delete this diagnosis from her system. She does have family history of early CAD, however has not noticed any recent chest pain, no joint edema, orthopnea or paroxysmal nocturnal dyspnea. She has not had any recurrence of the previous palpitation. She has not had significant complication with her current pregnancy.  I have discussed with DOD Dr. Martinique regarding her previous history. We have also reviewed  personally her EKG today, on her EKG, she does not have significant LVH. On physical exam, she has at most 1 out of 6 murmur, it is quite mild, and difficult to hear. Given lack of symptom and reassuring previous workup, both Dr. Martinique and I do not feel strongly about repeat echocardiogram. She is low risk for any procedure. Unless OB/GYN feel strongly about echocardiogram, there is currently no indication for repeating the echo. She has been doing quite well and can follow-up with cardiology service on an as-needed basis.   Past Medical History:  Diagnosis Date  . Hypothyroidism   . Patterson stone   . Mitral valve regurgitation    trace amount by echo in 2013  . Normal cardiac stress test    EF 65%, no ischemia 05/09/2012 at [place]  . Palpitation   . Thyroid disease   . Ventricular hypertrophy left   mild LVH seen on previous echo on 04/18/2012 at [place] in [facility]    Past Surgical History:  Procedure Laterality Date  . CESAREAN SECTION    . DILATION AND CURETTAGE OF UTERUS      Current Medications: Outpatient Medications Prior to Visit  Medication Sig Dispense Refill  . aspirin EC 81 MG tablet Take 81 mg by mouth daily.    . calcium-vitamin D (OSCAL 500/200 D-3) 500-200 MG-UNIT tablet Take 1 tablet by mouth 2 (two) times daily. 60 tablet 3  . levothyroxine (SYNTHROID) 175 MCG tablet Take 1 tablet (175 mcg total) by mouth daily before breakfast. 30 tablet 4  . Misc. Devices (BREAST PUMP) MISC Dispense one  breast pump for patient 1 each 0  . Prenatal Vit-Fe Fumarate-FA (MULTIVITAMIN-PRENATAL) 27-0.8 MG TABS tablet Take 1 tablet by mouth daily at 12 noon.     No facility-administered medications prior to visit.      Allergies:   Patient has no known allergies.   Social History   Social History  . Marital status: Single    Spouse name: N/A  . Number of children: N/A  . Years of education: N/A   Social History Main Topics  . Smoking status: Never  Smoker  . Smokeless tobacco: Never Used  . Alcohol use No  . Drug use: No  . Sexual activity: Yes    Birth control/ protection: None   Other Topics Concern  . None   Social History Narrative  . None     Family History:  The patient's family history includes CAD in her father; Heart attack in her paternal grandfather; Thyroid disease in her father, mother, and sister.   ROS:   Please see the history of present illness.    ROS All other systems reviewed and are negative.   PHYSICAL EXAM:   VS:  BP 130/84 (BP Location: Left Wrist)   Pulse 99   Ht 5\' 2"  (1.575 m)   Wt 229 lb (103.9 kg)   LMP 11/25/2016 (Exact Date)   BMI 41.88 kg/m    GEN: Well nourished, well developed, in no acute distress  HEENT: normal  Neck: no JVD, carotid bruits, or masses Cardiac: RRR; no rubs, or gallops,no edema  +faint murmur in apex, at most 1/6 Respiratory:  clear to auscultation bilaterally, normal work of breathing GI: soft, nontender, nondistended, + BS MS: no deformity or atrophy  Skin: warm and dry, no rash Neuro:  Alert and Oriented x 3, Strength and sensation are intact Psych: euthymic mood, full affect  Wt Readings from Last 3 Encounters:  06/21/17 229 lb (103.9 kg)  06/13/17 228 lb 3.2 oz (103.5 kg)  05/26/17 222 lb 3.2 oz (100.8 kg)      Studies/Labs Reviewed:   EKG:  EKG is ordered today.  The ekg ordered today demonstrates Sinus tachycardia without significant LVH, heart rate 100.  Recent Labs: 05/30/2017: Hemoglobin 12.1; Platelets 328 06/13/2017: TSH 1.480   Lipid Panel No results found for: CHOL, TRIG, HDL, CHOLHDL, VLDL, LDLCALC, LDLDIRECT  Additional studies/ records that were reviewed today include:   Review of outside echocardiogram, office note, and stress test in 2013.   ASSESSMENT:    1. Non-rheumatic mitral regurgitation   2. Mild concentric left ventricular hypertrophy (LVH)   3. Hypothyroidism, unspecified type   4. [redacted] weeks gestation of pregnancy       PLAN:  In order of problems listed above:  1. Trace mitral regurgitation: Very difficult to hear the heart murmur on physical exam, at most 1/6 in intensity. I have reviewed the previous record including echocardiogram and a stress test in 2013, there was no mention of any rheumatic valve disease.  2. Mild concentric LVH seen on previous echocardiogram: Today's EKG did not show any significant LVH, she has no symptom of dizziness, shortness of breath, blood pressure very well controlled. Although it is mildly elevated today, I have rechecked it myself manually, it was 105/50. I have discussed the case with the DOD Dr. Martinique, given lack of symptom and no EKG changes, would not recommend repeat echocardiogram unless OB/GYN feel strongly.  3. Hypothyroidism: On Synthroid   4. [redacted] weeks gestation: Continued follow-up by Dr.  Kennon Rounds, she is a low risk patient for any procedure from cardiology perspective.    Medication Adjustments/Labs and Tests Ordered: Current medicines are reviewed at length with the patient today.  Concerns regarding medicines are outlined above.  Medication changes, Labs and Tests ordered today are listed in the Patient Instructions below. Patient Instructions  Medication Instructions:   No changes  Labwork:   none  Testing/Procedures:  none  Follow-Up:  As needed with cardiology.  (We will contact you if a return workup is indicated based on previous medical records we requested from [facility].)  If you need a refill on your cardiac medications before your next appointment, please call your pharmacy.      Hilbert Corrigan, [place]  06/21/2017 2:27 PM    [facility] [place], [place], [place]  [address] Phone: [phone]; Fax: [phone]

## 2017-06-30 ENCOUNTER — Ambulatory Visit (INDEPENDENT_AMBULATORY_CARE_PROVIDER_SITE_OTHER): Admitting: Obstetrics and Gynecology

## 2017-06-30 VITALS — BP 118/68 | HR 94 | Wt 234.8 lb

## 2017-06-30 DIAGNOSIS — E559 Vitamin D deficiency, unspecified: Secondary | ICD-10-CM

## 2017-06-30 DIAGNOSIS — O0993 Supervision of high risk pregnancy, unspecified, third trimester: Secondary | ICD-10-CM

## 2017-06-30 DIAGNOSIS — E039 Hypothyroidism, unspecified: Secondary | ICD-10-CM

## 2017-06-30 DIAGNOSIS — Z3009 Encounter for other general counseling and advice on contraception: Secondary | ICD-10-CM | POA: Insufficient documentation

## 2017-06-30 DIAGNOSIS — I340 Nonrheumatic mitral (valve) insufficiency: Secondary | ICD-10-CM

## 2017-06-30 DIAGNOSIS — B951 Streptococcus, group B, as the cause of diseases classified elsewhere: Secondary | ICD-10-CM

## 2017-06-30 DIAGNOSIS — I517 Cardiomegaly: Secondary | ICD-10-CM

## 2017-06-30 DIAGNOSIS — O09523 Supervision of elderly multigravida, third trimester: Secondary | ICD-10-CM

## 2017-06-30 DIAGNOSIS — O2340 Unspecified infection of urinary tract in pregnancy, unspecified trimester: Secondary | ICD-10-CM

## 2017-06-30 DIAGNOSIS — O34219 Maternal care for unspecified type scar from previous cesarean delivery: Secondary | ICD-10-CM

## 2017-06-30 DIAGNOSIS — O99280 Endocrine, nutritional and metabolic diseases complicating pregnancy, unspecified trimester: Principal | ICD-10-CM

## 2017-06-30 DIAGNOSIS — O99283 Endocrine, nutritional and metabolic diseases complicating pregnancy, third trimester: Secondary | ICD-10-CM

## 2017-06-30 NOTE — Progress Notes (Signed)
Subjective:  Jenna Patterson is a 36 y.o. G3P1011 at [redacted]w[redacted]d being seen today for ongoing prenatal care.  She is currently monitored for the following issues for this high-risk pregnancy and has Supervision of high-risk pregnancy; Advanced maternal age in multigravida; Hypothyroid in pregnancy, antepartum; Hypothyroid; Previous cesarean delivery, antepartum; Mitral valve regurgitation; Ventricular hypertrophy; Group B Streptococcus urinary tract infection affecting pregnancy, antepartum; ASCUS with positive high risk HPV cervical; Vitamin D deficiency; and Unwanted fertility on her problem list.  Patient reports no complaints.  Contractions: Irregular.  .  Movement: Present. Denies leaking of fluid.   The following portions of the patient's history were reviewed and updated as appropriate: allergies, current medications, past family history, past medical history, past social history, past surgical history and problem list. Problem list updated.  Objective:   Vitals:   06/30/17 1347  BP: 118/68  Pulse: 94  Weight: 234 lb 12.8 oz (106.5 kg)    Fetal Status: Fetal Heart Rate (bpm): 150   Movement: Present     General:  Alert, oriented and cooperative. Patient is in no acute distress.  Skin: Skin is warm and dry. No rash noted.   Cardiovascular: Normal heart rate noted  Respiratory: Normal respiratory effort, no problems with respiration noted  Abdomen: Soft, gravid, appropriate for gestational age. Pain/Pressure: Present     Pelvic:  Cervical exam deferred        Extremities: Normal range of motion.  Edema: Trace  Mental Status: Normal mood and affect. Normal behavior. Normal judgment and thought content.   Urinalysis:      Assessment and Plan:  Pregnancy: G3P1011 at [redacted]w[redacted]d  1. Hypothyroid in pregnancy, antepartum Stable Continue with present management  2. Elderly multigravida in third trimester Nl NIPS  3. Supervision of high risk pregnancy in third trimester Stable  4. Previous  cesarean delivery, antepartum TOLAC desired, papers signed  5. Vitamin D deficiency Continue with supplement  6. Group B Streptococcus urinary tract infection affecting pregnancy, antepartum Tx while in labor  7. Ventricular hypertrophy Has seen cards, low risk from their standpoint, F/U PRN  8. Non-rheumatic mitral regurgitation See above  9. Unwanted fertility Papers signed today  Preterm labor symptoms and general obstetric precautions including but not limited to vaginal bleeding, contractions, leaking of fluid and fetal movement were reviewed in detail with the patient. Please refer to After Visit Summary for other counseling recommendations.  Return in about 2 weeks (around 07/14/2017) for OB visit.   Chancy Milroy, MD

## 2017-07-11 ENCOUNTER — Ambulatory Visit (INDEPENDENT_AMBULATORY_CARE_PROVIDER_SITE_OTHER): Admitting: Obstetrics and Gynecology

## 2017-07-11 VITALS — BP 109/67 | HR 94 | Wt 240.0 lb

## 2017-07-11 DIAGNOSIS — O34219 Maternal care for unspecified type scar from previous cesarean delivery: Secondary | ICD-10-CM

## 2017-07-11 DIAGNOSIS — Z3009 Encounter for other general counseling and advice on contraception: Secondary | ICD-10-CM

## 2017-07-11 DIAGNOSIS — O09523 Supervision of elderly multigravida, third trimester: Secondary | ICD-10-CM

## 2017-07-11 DIAGNOSIS — O2340 Unspecified infection of urinary tract in pregnancy, unspecified trimester: Secondary | ICD-10-CM

## 2017-07-11 DIAGNOSIS — O0993 Supervision of high risk pregnancy, unspecified, third trimester: Secondary | ICD-10-CM

## 2017-07-11 DIAGNOSIS — O99280 Endocrine, nutritional and metabolic diseases complicating pregnancy, unspecified trimester: Principal | ICD-10-CM

## 2017-07-11 DIAGNOSIS — O99283 Endocrine, nutritional and metabolic diseases complicating pregnancy, third trimester: Secondary | ICD-10-CM

## 2017-07-11 DIAGNOSIS — B951 Streptococcus, group B, as the cause of diseases classified elsewhere: Secondary | ICD-10-CM

## 2017-07-11 DIAGNOSIS — E039 Hypothyroidism, unspecified: Secondary | ICD-10-CM

## 2017-07-11 DIAGNOSIS — O2343 Unspecified infection of urinary tract in pregnancy, third trimester: Secondary | ICD-10-CM

## 2017-07-11 NOTE — Progress Notes (Signed)
Subjective:  Jenna Patterson is a 36 y.o. G3P1011 at [redacted]w[redacted]d being seen today for ongoing prenatal care.  She is currently monitored for the following issues for this high-risk pregnancy and has Supervision of high-risk pregnancy; Advanced maternal age in multigravida; Hypothyroid in pregnancy, antepartum; Hypothyroid; Previous cesarean delivery, antepartum; Mitral valve regurgitation; Ventricular hypertrophy; Group B Streptococcus urinary tract infection affecting pregnancy, antepartum; ASCUS with positive high risk HPV cervical; Vitamin D deficiency; and Unwanted fertility on her problem list.  Patient reports no complaints.  Contractions: Irregular. Vag. Bleeding: None.  Movement: Present. Denies leaking of fluid.   The following portions of the patient's history were reviewed and updated as appropriate: allergies, current medications, past family history, past medical history, past social history, past surgical history and problem list. Problem list updated.  Objective:   Vitals:   07/11/17 1448  BP: 109/67  Pulse: 94  Weight: 240 lb (108.9 kg)    Fetal Status: Fetal Heart Rate (bpm): 153   Movement: Present     General:  Alert, oriented and cooperative. Patient is in no acute distress.  Skin: Skin is warm and dry. No rash noted.   Cardiovascular: Normal heart rate noted  Respiratory: Normal respiratory effort, no problems with respiration noted  Abdomen: Soft, gravid, appropriate for gestational age. Pain/Pressure: Present     Pelvic:  Cervical exam deferred        Extremities: Normal range of motion.  Edema: Trace  Mental Status: Normal mood and affect. Normal behavior. Normal judgment and thought content.   Urinalysis:      Assessment and Plan:  Pregnancy: G3P1011 at [redacted]w[redacted]d  1. Hypothyroid in pregnancy, antepartum Stable  2. Elderly multigravida in third trimester Low risks NIPS  3. Supervision of high risk pregnancy in third trimester Satble  4. Previous cesarean  delivery, antepartum Desires TOLAC, papers signed  5. Unwanted fertility BTL papers signed 06/30/17  6. Group B Streptococcus urinary tract infection affecting pregnancy, antepartum Treat while in labor  Preterm labor symptoms and general obstetric precautions including but not limited to vaginal bleeding, contractions, leaking of fluid and fetal movement were reviewed in detail with the patient. Please refer to After Visit Summary for other counseling recommendations.  Return in about 2 weeks (around 07/25/2017) for OB visit.   Chancy Milroy, MD

## 2017-07-25 ENCOUNTER — Ambulatory Visit (INDEPENDENT_AMBULATORY_CARE_PROVIDER_SITE_OTHER): Admitting: Student

## 2017-07-25 VITALS — BP 111/58 | HR 88 | Wt 243.4 lb

## 2017-07-25 DIAGNOSIS — E039 Hypothyroidism, unspecified: Secondary | ICD-10-CM

## 2017-07-25 DIAGNOSIS — O99283 Endocrine, nutritional and metabolic diseases complicating pregnancy, third trimester: Secondary | ICD-10-CM

## 2017-07-25 DIAGNOSIS — O99280 Endocrine, nutritional and metabolic diseases complicating pregnancy, unspecified trimester: Principal | ICD-10-CM

## 2017-07-25 DIAGNOSIS — O34219 Maternal care for unspecified type scar from previous cesarean delivery: Secondary | ICD-10-CM

## 2017-07-25 NOTE — Progress Notes (Signed)
Educated pt on Benefits of Breastfeeding for Baby OB US scheduled for August 20th @ 0815.  Pt notified.

## 2017-07-25 NOTE — Patient Instructions (Addendum)

## 2017-07-26 LAB — TSH: TSH: 1.26 u[IU]/mL (ref 0.450–4.500)

## 2017-07-26 NOTE — Progress Notes (Signed)
   PRENATAL VISIT NOTE  Subjective:  Jenna Patterson is a 36 y.o. G3P1011 at [redacted]w[redacted]d being seen today for ongoing prenatal care.  She is currently monitored for the following issues for this high-risk pregnancy and has Supervision of high-risk pregnancy; Advanced maternal age in multigravida; Hypothyroid in pregnancy, antepartum; Hypothyroid; Previous cesarean delivery, antepartum; Mitral valve regurgitation; Ventricular hypertrophy; Group B Streptococcus urinary tract infection affecting pregnancy, antepartum; ASCUS with positive high risk HPV cervical; Vitamin D deficiency; and Unwanted fertility on her problem list.  Patient reports no complaints.  Contractions: Irregular. Vag. Bleeding: None.  Movement: Present. Denies leaking of fluid.   The following portions of the patient's history were reviewed and updated as appropriate: allergies, current medications, past family history, past medical history, past social history, past surgical history and problem list. Problem list updated.  Objective:   Vitals:   07/25/17 1506  BP: (!) 111/58  Pulse: 88  Weight: 243 lb 6.4 oz (110.4 kg)    Fetal Status: Fetal Heart Rate (bpm): 151 Fundal Height: 37 cm Movement: Present     General:  Alert, oriented and cooperative. Patient is in no acute distress.  Skin: Skin is warm and dry. No rash noted.   Cardiovascular: Normal heart rate noted  Respiratory: Normal respiratory effort, no problems with respiration noted  Abdomen: Soft, gravid, appropriate for gestational age.  Pain/Pressure: Present     Pelvic: Cervical exam deferred        Extremities: Normal range of motion.  Edema: Trace  Mental Status:  Normal mood and affect. Normal behavior. Normal judgment and thought content.   Assessment and Plan:  Pregnancy: G3P1011 at [redacted]w[redacted]d  1. Previous cesarean delivery, antepartum  - TSH - [place] MFM OB FOLLOW UP; Future  2. Hypothyroid in pregnancy, antepartum -Redraw TSH today -Patient's fundal height  measuring 37 weeks, although may be due to pannus. Will order [place] MFM Follow-up for growth.   Preterm labor symptoms and general obstetric precautions including but not limited to vaginal bleeding, contractions, leaking of fluid and fetal movement were reviewed in detail with the patient. Please refer to After Visit Summary for other counseling recommendations.  Return in about 2 weeks (around 08/08/2017).   Starr Lake, CNM

## 2017-08-08 ENCOUNTER — Ambulatory Visit (HOSPITAL_COMMUNITY)
Admission: RE | Admit: 2017-08-08 | Discharge: 2017-08-08 | Disposition: A | Discharge status: Home / Self Care | Source: Ambulatory Visit | Attending: Student | Admitting: Student

## 2017-08-08 DIAGNOSIS — E039 Hypothyroidism, unspecified: Secondary | ICD-10-CM | POA: Diagnosis not present

## 2017-08-08 DIAGNOSIS — O99213 Obesity complicating pregnancy, third trimester: Secondary | ICD-10-CM | POA: Diagnosis not present

## 2017-08-08 DIAGNOSIS — Z3689 Encounter for other specified antenatal screening: Secondary | ICD-10-CM | POA: Insufficient documentation

## 2017-08-08 DIAGNOSIS — O99283 Endocrine, nutritional and metabolic diseases complicating pregnancy, third trimester: Secondary | ICD-10-CM | POA: Insufficient documentation

## 2017-08-08 DIAGNOSIS — O34219 Maternal care for unspecified type scar from previous cesarean delivery: Secondary | ICD-10-CM | POA: Diagnosis not present

## 2017-08-08 DIAGNOSIS — Z3A36 36 weeks gestation of pregnancy: Secondary | ICD-10-CM | POA: Diagnosis not present

## 2017-08-08 DIAGNOSIS — E669 Obesity, unspecified: Secondary | ICD-10-CM | POA: Insufficient documentation

## 2017-08-08 DIAGNOSIS — O09523 Supervision of elderly multigravida, third trimester: Secondary | ICD-10-CM | POA: Diagnosis not present

## 2017-08-08 DIAGNOSIS — Z6833 Body mass index (BMI) 33.0-33.9, adult: Secondary | ICD-10-CM | POA: Diagnosis not present

## 2017-08-09 ENCOUNTER — Encounter: Payer: Self-pay | Admitting: Student

## 2017-08-09 ENCOUNTER — Ambulatory Visit (INDEPENDENT_AMBULATORY_CARE_PROVIDER_SITE_OTHER): Admitting: Obstetrics and Gynecology

## 2017-08-09 VITALS — BP 102/80 | HR 99 | Wt 243.2 lb

## 2017-08-09 DIAGNOSIS — O09523 Supervision of elderly multigravida, third trimester: Secondary | ICD-10-CM

## 2017-08-09 DIAGNOSIS — O2340 Unspecified infection of urinary tract in pregnancy, unspecified trimester: Secondary | ICD-10-CM

## 2017-08-09 DIAGNOSIS — O3660X0 Maternal care for excessive fetal growth, unspecified trimester, not applicable or unspecified: Secondary | ICD-10-CM

## 2017-08-09 DIAGNOSIS — O34219 Maternal care for unspecified type scar from previous cesarean delivery: Secondary | ICD-10-CM

## 2017-08-09 DIAGNOSIS — E039 Hypothyroidism, unspecified: Secondary | ICD-10-CM

## 2017-08-09 DIAGNOSIS — O0993 Supervision of high risk pregnancy, unspecified, third trimester: Secondary | ICD-10-CM

## 2017-08-09 DIAGNOSIS — O99280 Endocrine, nutritional and metabolic diseases complicating pregnancy, unspecified trimester: Secondary | ICD-10-CM

## 2017-08-09 DIAGNOSIS — I517 Cardiomegaly: Secondary | ICD-10-CM

## 2017-08-09 DIAGNOSIS — B951 Streptococcus, group B, as the cause of diseases classified elsewhere: Secondary | ICD-10-CM

## 2017-08-09 HISTORY — DX: Maternal care for excessive fetal growth, unspecified trimester, not applicable or unspecified: O36.60X0

## 2017-08-15 ENCOUNTER — Telehealth: Payer: Self-pay | Admitting: General Practice

## 2017-08-15 NOTE — Telephone Encounter (Signed)
Patient called and left message stating she saw Dr Ilda Basset last week and was supposed to be given a csection date but still hasn't heard anything. Called patient, no answer- unable to leave VM due to voicemail box full.

## 2017-08-16 NOTE — Telephone Encounter (Signed)
Let her know that she should here something on 8/28. Thanks!

## 2017-08-16 NOTE — Progress Notes (Signed)
Prenatal Visit Note Date: 08/09/2017 Clinic: [facility]  Subjective:  Jenna Patterson is a 36 y.o. G3P1011 at [redacted]w[redacted]d being seen today for ongoing prenatal care.  She is currently monitored for the following issues for this high-risk pregnancy and has Supervision of high-risk pregnancy; Advanced maternal age in multigravida; Hypothyroid in pregnancy, antepartum; Hypothyroid; Previous cesarean delivery, antepartum; Ventricular hypertrophy; Group B Streptococcus urinary tract infection affecting pregnancy, antepartum; ASCUS with positive high risk HPV cervical; Vitamin D deficiency; Unwanted fertility; and LGA (large for gestational age) fetus affecting management of mother on her problem list.  Patient reports no complaints.   Contractions: Irregular. Vag. Bleeding: None.  Movement: Present. Denies leaking of fluid.   The following portions of the patient's history were reviewed and updated as appropriate: allergies, current medications, past family history, past medical history, past social history, past surgical history and problem list. Problem list updated.  Objective:   Vitals:   08/09/17 1444  BP: 102/80  Pulse: 99  Weight: 243 lb 3.2 oz (110.3 kg)    Fetal Status: Fetal Heart Rate (bpm): 140s Fundal Height: 38 cm Movement: Present  Presentation: Vertex  General:  Alert, oriented and cooperative. Patient is in no acute distress.  Skin: Skin is warm and dry. No rash noted.   Cardiovascular: Normal heart rate noted  Respiratory: Normal respiratory effort, no problems with respiration noted  Abdomen: Soft, gravid, appropriate for gestational age. Pain/Pressure: Present     Pelvic:  Cervical exam deferred        Extremities: Normal range of motion.  Edema: Trace  Mental Status: Normal mood and affect. Normal behavior. Normal judgment and thought content.   Urinalysis:      Assessment and Plan:  Pregnancy: G3P1011 at [redacted]w[redacted]d  1. Supervision of high risk  pregnancy in third trimester Routine care. D/w pt re: BC nv  2. Group B Streptococcus urinary tract infection affecting pregnancy, antepartum  3. Hypothyroid in pregnancy, antepartum Normal TSH early august. Continue synthroid  4. Previous cesarean delivery, antepartum Request sent  5. Elderly multigravida in third trimester No issues  Preterm labor symptoms and general obstetric precautions including but not limited to vaginal bleeding, contractions, leaking of fluid and fetal movement were reviewed in detail with the patient. Please refer to After Visit Summary for other counseling recommendations.  Return in about 1 week (around 08/16/2017) for rob.   Aletha Halim, MD

## 2017-08-17 ENCOUNTER — Ambulatory Visit (INDEPENDENT_AMBULATORY_CARE_PROVIDER_SITE_OTHER): Admitting: Advanced Practice Midwife

## 2017-08-17 ENCOUNTER — Encounter (HOSPITAL_COMMUNITY): Payer: Self-pay

## 2017-08-17 VITALS — BP 112/74 | HR 96 | Wt 243.8 lb

## 2017-08-17 DIAGNOSIS — O368130 Decreased fetal movements, third trimester, not applicable or unspecified: Secondary | ICD-10-CM | POA: Diagnosis not present

## 2017-08-17 DIAGNOSIS — O3660X0 Maternal care for excessive fetal growth, unspecified trimester, not applicable or unspecified: Secondary | ICD-10-CM

## 2017-08-17 DIAGNOSIS — E559 Vitamin D deficiency, unspecified: Secondary | ICD-10-CM

## 2017-08-17 DIAGNOSIS — O34219 Maternal care for unspecified type scar from previous cesarean delivery: Secondary | ICD-10-CM

## 2017-08-17 NOTE — Progress Notes (Signed)
   PRENATAL VISIT NOTE  Subjective:  Jenna Patterson is a 36 y.o. G3P1011 at [redacted]w[redacted]d being seen today for ongoing prenatal care.  She is currently monitored for the following issues for this high-risk pregnancy and has Supervision of high-risk pregnancy; Advanced maternal age in multigravida; Hypothyroid in pregnancy, antepartum; Hypothyroid; Previous cesarean delivery, antepartum; Ventricular hypertrophy; Group B Streptococcus urinary tract infection affecting pregnancy, antepartum; ASCUS with positive high risk HPV cervical; Vitamin D deficiency; Unwanted fertility; and LGA (large for gestational age) fetus affecting management of mother on her problem list.  Patient reports decreased fetal movement x 3 days.  Contractions: Irregular. Vag. Bleeding: None.  Movement: (!) Decreased. Denies leaking of fluid.   The following portions of the patient's history were reviewed and updated as appropriate: allergies, current medications, past family history, past medical history, past social history, past surgical history and problem list. Problem list updated.  Objective:   Vitals:   08/17/17 1258  BP: 112/74  Pulse: 96  Weight: 243 lb 12.8 oz (110.6 kg)    Fetal Status: Fetal Heart Rate (bpm): 138   Movement: (!) Decreased     General:  Alert, oriented and cooperative. Patient is in no acute distress.  Skin: Skin is warm and dry. No rash noted.   Cardiovascular: Normal heart rate noted  Respiratory: Normal respiratory effort, no problems with respiration noted  Abdomen: Soft, gravid, appropriate for gestational age.  Pain/Pressure: Present     Pelvic: Cervical exam deferred        Extremities: Normal range of motion.  Edema: Trace  Mental Status:  Normal mood and affect. Normal behavior. Normal judgment and thought content.   Assessment and Plan:  Pregnancy: G3P1011 at [redacted]w[redacted]d  1. Vitamin D deficiency   2. Decreased fetal movements in third trimester, single or unspecified fetus --NST  reactive today, reassurance provided --Fetal movement counting reviewed  3. Previous cesarean delivery, antepartum --Now desires repeat C/S, scheduled at 39 weeks. May consider TOLAC if she labors before scheduled date.  4. Excessive fetal growth affecting management of pregnancy, antepartum, single or unspecified fetus --[place] at 36 weeks with 83%tile, 95%tile AC  Term labor symptoms and general obstetric precautions including but not limited to vaginal bleeding, contractions, leaking of fluid and fetal movement were reviewed in detail with the patient. Please refer to After Visit Summary for other counseling recommendations.  Return in about 1 week (around 08/24/2017).   Fatima Blank, CNM

## 2017-08-17 NOTE — Patient Instructions (Signed)

## 2017-08-17 NOTE — Progress Notes (Signed)
Patient is here today for Routine OB visit [redacted]w[redacted]d. Patient wants to go over Ultrasound results.

## 2017-08-18 NOTE — Patient Instructions (Signed)
[place]  08/18/2017   Your procedure is scheduled on:  08/26/2017  Enter through the Main Entrance of [facility] at [place] up the phone at the desk and dial [phone].   Call this number if you have problems the morning of surgery: [phone]   Remember:   Do not eat food:After Midnight.  Do not drink clear liquids: After Midnight.  Take these medicines the morning of surgery with A SIP OF WATER: take your synthroid with a sip of water   Do not wear jewelry, make-up or nail polish.  Do not wear lotions, powders, or perfumes. Do not wear deodorant.  Do not shave 48 hours prior to surgery.  Do not bring valuables to the hospital.  [facility] is not   responsible for any belongings or valuables brought to the hospital.  Contacts, dentures or bridgework may not be worn into surgery.  Leave suitcase in the car. After surgery it may be brought to your room.  For patients admitted to the hospital, checkout time is 11:00 AM the day of              discharge.   Patients discharged the day of surgery will not be allowed to drive             home.  Name and phone number of your driver: na  Special Instructions:   N/A   Please read over the following fact sheets that you were given:   Surgical Site Infection Prevention

## 2017-08-25 ENCOUNTER — Other Ambulatory Visit: Payer: Self-pay | Admitting: Obstetrics and Gynecology

## 2017-08-25 ENCOUNTER — Encounter (HOSPITAL_COMMUNITY)
Admission: RE | Admit: 2017-08-25 | Discharge: 2017-08-25 | Disposition: A | Discharge status: Home / Self Care | Source: Ambulatory Visit | Attending: Obstetrics and Gynecology | Admitting: Obstetrics and Gynecology

## 2017-08-25 ENCOUNTER — Ambulatory Visit (INDEPENDENT_AMBULATORY_CARE_PROVIDER_SITE_OTHER): Admitting: Obstetrics & Gynecology

## 2017-08-25 ENCOUNTER — Encounter: Payer: Self-pay | Admitting: Obstetrics & Gynecology

## 2017-08-25 VITALS — BP 95/60 | HR 105 | Wt 247.7 lb

## 2017-08-25 DIAGNOSIS — O0993 Supervision of high risk pregnancy, unspecified, third trimester: Secondary | ICD-10-CM

## 2017-08-25 DIAGNOSIS — O34219 Maternal care for unspecified type scar from previous cesarean delivery: Secondary | ICD-10-CM

## 2017-08-25 DIAGNOSIS — O2343 Unspecified infection of urinary tract in pregnancy, third trimester: Secondary | ICD-10-CM

## 2017-08-25 DIAGNOSIS — O09523 Supervision of elderly multigravida, third trimester: Secondary | ICD-10-CM

## 2017-08-25 DIAGNOSIS — O2340 Unspecified infection of urinary tract in pregnancy, unspecified trimester: Secondary | ICD-10-CM

## 2017-08-25 DIAGNOSIS — B951 Streptococcus, group B, as the cause of diseases classified elsewhere: Secondary | ICD-10-CM

## 2017-08-25 LAB — TYPE AND SCREEN
ABO/RH(D): O POS
ANTIBODY SCREEN: NEGATIVE

## 2017-08-25 LAB — CBC
HEMATOCRIT: 35 % — AB (ref 36.0–46.0)
HEMOGLOBIN: 11.9 g/dL — AB (ref 12.0–15.0)
MCH: 28.5 pg (ref 26.0–34.0)
MCHC: 34 g/dL (ref 30.0–36.0)
MCV: 83.7 fL (ref 78.0–100.0)
Platelets: 324 10*3/uL (ref 150–400)
RBC: 4.18 MIL/uL (ref 3.87–5.11)
RDW: 13.5 % (ref 11.5–15.5)
WBC: 12.5 10*3/uL — ABNORMAL HIGH (ref 4.0–10.5)

## 2017-08-25 LAB — ABO/RH: ABO/RH(D): O POS

## 2017-08-25 NOTE — Progress Notes (Signed)
   PRENATAL VISIT NOTE  Subjective:  Jenna Patterson is a 36 y.o. G3P1011 at [redacted]w[redacted]d being seen today for ongoing prenatal care.  She is currently monitored for the following issues for this low-risk pregnancy and has Supervision of high-risk pregnancy; Advanced maternal age in multigravida; Hypothyroid in pregnancy, antepartum; Hypothyroid; Previous cesarean delivery, antepartum; Ventricular hypertrophy; Group B Streptococcus urinary tract infection affecting pregnancy, antepartum; ASCUS with positive high risk HPV cervical; Vitamin D deficiency; Unwanted fertility; and LGA (large for gestational age) fetus affecting management of mother on her problem list.  Patient reports no complaints.  Contractions: Irregular. Vag. Bleeding: None.  Movement: Present. Denies leaking of fluid.   The following portions of the patient's history were reviewed and updated as appropriate: allergies, current medications, past family history, past medical history, past social history, past surgical history and problem list. Problem list updated.  Objective:   Vitals:   08/25/17 1326 08/25/17 1329  BP: (!) 93/59 95/60  Pulse: (!) 102 (!) 105  Weight: 247 lb 11.2 oz (112.4 kg)     Fetal Status: Fetal Heart Rate (bpm): 150   Movement: Present     General:  Alert, oriented and cooperative. Patient is in no acute distress.  Skin: Skin is warm and dry. No rash noted.   Cardiovascular: Normal heart rate noted  Respiratory: Normal respiratory effort, no problems with respiration noted  Abdomen: Soft, gravid, appropriate for gestational age.  Pain/Pressure: Present     Pelvic: Cervical exam deferred        Extremities: Normal range of motion.  Edema: Trace  Mental Status:  Normal mood and affect. Normal behavior. Normal judgment and thought content.   Assessment and Plan:  Pregnancy: G3P1011 at [redacted]w[redacted]d  1. Elderly multigravida in third trimester - Normal NIPS  2. Supervision of high risk pregnancy in third  trimester   3. Previous cesarean delivery, antepartum - RLTCS and BTL is already scheduled for tomorrow with Dr. Rip Harbour  4. Group B Streptococcus urinary tract infection affecting pregnancy, antepartum   Term labor symptoms and general obstetric precautions including but not limited to vaginal bleeding, contractions, leaking of fluid and fetal movement were reviewed in detail with the patient. Please refer to After Visit Summary for other counseling recommendations.  Return in about 1 week (around 09/01/2017).   Emily Filbert, MD

## 2017-08-26 ENCOUNTER — Inpatient Hospital Stay (HOSPITAL_COMMUNITY): Admitting: Anesthesiology

## 2017-08-26 ENCOUNTER — Encounter (HOSPITAL_COMMUNITY): Payer: Self-pay

## 2017-08-26 ENCOUNTER — Inpatient Hospital Stay (HOSPITAL_COMMUNITY)
Admission: RE | Admit: 2017-08-26 | Discharge: 2017-08-28 | DRG: 765 | Disposition: A | Discharge status: Home / Self Care | Source: Ambulatory Visit | Attending: Obstetrics and Gynecology | Admitting: Obstetrics and Gynecology

## 2017-08-26 ENCOUNTER — Encounter (HOSPITAL_COMMUNITY)
Admission: RE | Disposition: A | Discharge status: Home / Self Care | Payer: Self-pay | Source: Ambulatory Visit | Attending: Obstetrics and Gynecology

## 2017-08-26 DIAGNOSIS — O3663X0 Maternal care for excessive fetal growth, third trimester, not applicable or unspecified: Secondary | ICD-10-CM | POA: Diagnosis present

## 2017-08-26 DIAGNOSIS — O99214 Obesity complicating childbirth: Secondary | ICD-10-CM | POA: Diagnosis present

## 2017-08-26 DIAGNOSIS — O34219 Maternal care for unspecified type scar from previous cesarean delivery: Secondary | ICD-10-CM

## 2017-08-26 DIAGNOSIS — O99284 Endocrine, nutritional and metabolic diseases complicating childbirth: Secondary | ICD-10-CM | POA: Diagnosis present

## 2017-08-26 DIAGNOSIS — O34211 Maternal care for low transverse scar from previous cesarean delivery: Principal | ICD-10-CM | POA: Diagnosis present

## 2017-08-26 DIAGNOSIS — O99280 Endocrine, nutritional and metabolic diseases complicating pregnancy, unspecified trimester: Secondary | ICD-10-CM

## 2017-08-26 DIAGNOSIS — B951 Streptococcus, group B, as the cause of diseases classified elsewhere: Secondary | ICD-10-CM

## 2017-08-26 DIAGNOSIS — Z302 Encounter for sterilization: Secondary | ICD-10-CM | POA: Diagnosis not present

## 2017-08-26 DIAGNOSIS — E039 Hypothyroidism, unspecified: Secondary | ICD-10-CM | POA: Diagnosis present

## 2017-08-26 DIAGNOSIS — Z6841 Body Mass Index (BMI) 40.0 and over, adult: Secondary | ICD-10-CM

## 2017-08-26 DIAGNOSIS — Z98891 History of uterine scar from previous surgery: Secondary | ICD-10-CM

## 2017-08-26 DIAGNOSIS — R87810 Cervical high risk human papillomavirus (HPV) DNA test positive: Secondary | ICD-10-CM

## 2017-08-26 DIAGNOSIS — Z3A39 39 weeks gestation of pregnancy: Secondary | ICD-10-CM

## 2017-08-26 DIAGNOSIS — O2340 Unspecified infection of urinary tract in pregnancy, unspecified trimester: Secondary | ICD-10-CM

## 2017-08-26 DIAGNOSIS — R87610 Atypical squamous cells of undetermined significance on cytologic smear of cervix (ASC-US): Secondary | ICD-10-CM

## 2017-08-26 HISTORY — PX: TUBAL LIGATION: SHX77

## 2017-08-26 LAB — RPR: RPR: NONREACTIVE

## 2017-08-26 SURGERY — Surgical Case
Anesthesia: Spinal

## 2017-08-26 MED ORDER — CEFAZOLIN SODIUM-DEXTROSE 2-4 GM/100ML-% IV SOLN
2.0000 g | INTRAVENOUS | Status: AC
  Administered 2017-08-26: 2 g via INTRAVENOUS
  Filled 2017-08-26: qty 100

## 2017-08-26 MED ORDER — SIMETHICONE 80 MG PO CHEW
80.0000 mg | CHEWABLE_TABLET | ORAL | Status: DC
  Administered 2017-08-26 – 2017-08-27 (×2): 80 mg via ORAL
  Filled 2017-08-26 (×2): qty 1

## 2017-08-26 MED ORDER — MIDAZOLAM HCL 2 MG/2ML IJ SOLN
INTRAMUSCULAR | Status: AC
  Filled 2017-08-26: qty 2

## 2017-08-26 MED ORDER — CHLOROPROCAINE HCL (PF) 3 % IJ SOLN
INTRAMUSCULAR | Status: AC
  Filled 2017-08-26: qty 20

## 2017-08-26 MED ORDER — MORPHINE SULFATE (PF) 0.5 MG/ML IJ SOLN
INTRAMUSCULAR | Status: DC | PRN
  Administered 2017-08-26: .2 mg via INTRATHECAL

## 2017-08-26 MED ORDER — OXYTOCIN 10 UNIT/ML IJ SOLN
INTRAMUSCULAR | Status: AC
  Filled 2017-08-26: qty 4

## 2017-08-26 MED ORDER — BUPIVACAINE IN DEXTROSE 0.75-8.25 % IT SOLN
INTRATHECAL | Status: DC | PRN
Start: 2017-08-26 — End: 2017-08-26
  Administered 2017-08-26: 1.2 mL via INTRATHECAL

## 2017-08-26 MED ORDER — MORPHINE SULFATE (PF) 4 MG/ML IV SOLN
INTRAVENOUS | Status: AC
  Filled 2017-08-26: qty 1

## 2017-08-26 MED ORDER — SIMETHICONE 80 MG PO CHEW: 80.0000 mg | CHEWABLE_TABLET | ORAL | Status: DC | PRN

## 2017-08-26 MED ORDER — EPHEDRINE SULFATE 50 MG/ML IJ SOLN
INTRAMUSCULAR | Status: DC | PRN
  Administered 2017-08-26: 5 mg via INTRAVENOUS

## 2017-08-26 MED ORDER — SOD CITRATE-CITRIC ACID 500-334 MG/5ML PO SOLN
30.0000 mL | Freq: Once | ORAL | Status: AC
  Administered 2017-08-26: 30 mL via ORAL
  Filled 2017-08-26: qty 15

## 2017-08-26 MED ORDER — SODIUM CHLORIDE 0.9 % IR SOLN
Status: DC | PRN
  Administered 2017-08-26: 1

## 2017-08-26 MED ORDER — EPHEDRINE 5 MG/ML INJ
INTRAVENOUS | Status: AC
  Filled 2017-08-26: qty 10

## 2017-08-26 MED ORDER — MEPERIDINE HCL 25 MG/ML IJ SOLN: 6.2500 mg | INTRAMUSCULAR | Status: DC | PRN

## 2017-08-26 MED ORDER — LACTATED RINGERS IV SOLN
INTRAVENOUS | Status: DC
  Administered 2017-08-26 (×3): via INTRAVENOUS

## 2017-08-26 MED ORDER — FUROSEMIDE 10 MG/ML IJ SOLN
INTRAMUSCULAR | Status: AC
  Filled 2017-08-26: qty 2

## 2017-08-26 MED ORDER — TETANUS-DIPHTH-ACELL PERTUSSIS 5-2.5-18.5 LF-MCG/0.5 IM SUSP: 0.5000 mL | Freq: Once | INTRAMUSCULAR | Status: DC

## 2017-08-26 MED ORDER — MENTHOL 3 MG MT LOZG: 1.0000 | LOZENGE | OROMUCOSAL | Status: DC | PRN

## 2017-08-26 MED ORDER — OXYTOCIN 40 UNITS IN LACTATED RINGERS INFUSION - SIMPLE MED: 2.5000 [IU]/h | INTRAVENOUS | Status: AC

## 2017-08-26 MED ORDER — MIDAZOLAM HCL 2 MG/2ML IJ SOLN
INTRAMUSCULAR | Status: DC | PRN
  Administered 2017-08-26 (×2): 1 mg via INTRAVENOUS

## 2017-08-26 MED ORDER — DIPHENHYDRAMINE HCL 25 MG PO CAPS: 25.0000 mg | ORAL_CAPSULE | Freq: Four times a day (QID) | ORAL | Status: DC | PRN

## 2017-08-26 MED ORDER — FUROSEMIDE 10 MG/ML IJ SOLN
INTRAMUSCULAR | Status: DC | PRN
  Administered 2017-08-26: 10 mg via INTRAMUSCULAR

## 2017-08-26 MED ORDER — WITCH HAZEL-GLYCERIN EX PADS: 1.0000 "application " | MEDICATED_PAD | CUTANEOUS | Status: DC | PRN

## 2017-08-26 MED ORDER — PHENYLEPHRINE 8 MG IN D5W 100 ML (0.08MG/ML) PREMIX OPTIME
INJECTION | INTRAVENOUS | Status: DC | PRN
  Administered 2017-08-26: 60 ug/min via INTRAVENOUS

## 2017-08-26 MED ORDER — IBUPROFEN 600 MG PO TABS
600.0000 mg | ORAL_TABLET | Freq: Four times a day (QID) | ORAL | Status: DC
  Administered 2017-08-27 – 2017-08-28 (×3): 600 mg via ORAL
  Filled 2017-08-26 (×3): qty 1

## 2017-08-26 MED ORDER — CHLOROPROCAINE HCL (PF) 3 % IJ SOLN
INTRAMUSCULAR | Status: DC | PRN
  Administered 2017-08-26: 20 mL

## 2017-08-26 MED ORDER — LEVOTHYROXINE SODIUM 175 MCG PO TABS
175.0000 ug | ORAL_TABLET | Freq: Every day | ORAL | Status: DC
  Administered 2017-08-27 – 2017-08-28 (×2): 175 ug via ORAL
  Filled 2017-08-26 (×2): qty 1

## 2017-08-26 MED ORDER — SCOPOLAMINE 1 MG/3DAYS TD PT72
1.0000 | MEDICATED_PATCH | Freq: Once | TRANSDERMAL | Status: DC
  Administered 2017-08-26: 1.5 mg via TRANSDERMAL
  Filled 2017-08-26: qty 1

## 2017-08-26 MED ORDER — ACETAMINOPHEN 325 MG PO TABS
650.0000 mg | ORAL_TABLET | ORAL | Status: DC | PRN
  Administered 2017-08-27 – 2017-08-28 (×3): 650 mg via ORAL
  Filled 2017-08-26 (×4): qty 2

## 2017-08-26 MED ORDER — SENNOSIDES-DOCUSATE SODIUM 8.6-50 MG PO TABS
2.0000 | ORAL_TABLET | ORAL | Status: DC
  Administered 2017-08-26 – 2017-08-27 (×2): 2 via ORAL
  Filled 2017-08-26 (×2): qty 2

## 2017-08-26 MED ORDER — PROMETHAZINE HCL 25 MG/ML IJ SOLN
INTRAMUSCULAR | Status: AC
  Filled 2017-08-26: qty 1

## 2017-08-26 MED ORDER — DEXAMETHASONE SODIUM PHOSPHATE 4 MG/ML IJ SOLN
INTRAMUSCULAR | Status: AC
  Filled 2017-08-26: qty 1

## 2017-08-26 MED ORDER — PRENATAL MULTIVITAMIN CH
1.0000 | ORAL_TABLET | Freq: Every day | ORAL | Status: DC
  Administered 2017-08-27 – 2017-08-28 (×2): 1 via ORAL
  Filled 2017-08-26 (×2): qty 1

## 2017-08-26 MED ORDER — NALBUPHINE HCL 10 MG/ML IJ SOLN: 5.0000 mg | INTRAMUSCULAR | Status: DC | PRN

## 2017-08-26 MED ORDER — ZOLPIDEM TARTRATE 5 MG PO TABS: 5.0000 mg | ORAL_TABLET | Freq: Every evening | ORAL | Status: DC | PRN

## 2017-08-26 MED ORDER — FENTANYL CITRATE (PF) 100 MCG/2ML IJ SOLN
INTRAMUSCULAR | Status: AC
  Filled 2017-08-26: qty 2

## 2017-08-26 MED ORDER — MIDAZOLAM HCL 2 MG/2ML IJ SOLN: 0.5000 mg | Freq: Once | INTRAMUSCULAR | Status: DC | PRN

## 2017-08-26 MED ORDER — ONDANSETRON HCL 4 MG/2ML IJ SOLN
INTRAMUSCULAR | Status: DC | PRN
  Administered 2017-08-26: 4 mg via INTRAVENOUS

## 2017-08-26 MED ORDER — HYDROMORPHONE HCL 1 MG/ML IJ SOLN
INTRAMUSCULAR | Status: AC
  Filled 2017-08-26: qty 0.5

## 2017-08-26 MED ORDER — KETOROLAC TROMETHAMINE 30 MG/ML IJ SOLN
INTRAMUSCULAR | Status: AC
  Filled 2017-08-26: qty 1

## 2017-08-26 MED ORDER — PHENYLEPHRINE HCL 10 MG/ML IJ SOLN
INTRAMUSCULAR | Status: DC | PRN
  Administered 2017-08-26: 100 ug via INTRAVENOUS
  Administered 2017-08-26: 80 ug via INTRAVENOUS

## 2017-08-26 MED ORDER — KETOROLAC TROMETHAMINE 30 MG/ML IJ SOLN
30.0000 mg | Freq: Three times a day (TID) | INTRAMUSCULAR | Status: AC
  Administered 2017-08-26 – 2017-08-27 (×3): 30 mg via INTRAVENOUS
  Filled 2017-08-26 (×3): qty 1

## 2017-08-26 MED ORDER — BUPIVACAINE IN DEXTROSE 0.75-8.25 % IT SOLN
INTRATHECAL | Status: AC
  Filled 2017-08-26: qty 2

## 2017-08-26 MED ORDER — KETOROLAC TROMETHAMINE 30 MG/ML IJ SOLN: 30.0000 mg | Freq: Once | INTRAMUSCULAR | Status: DC

## 2017-08-26 MED ORDER — OXYCODONE HCL 5 MG PO TABS
10.0000 mg | ORAL_TABLET | ORAL | Status: DC | PRN
  Administered 2017-08-28 (×2): 10 mg via ORAL
  Filled 2017-08-26 (×2): qty 2

## 2017-08-26 MED ORDER — KETOROLAC TROMETHAMINE 30 MG/ML IJ SOLN
INTRAMUSCULAR | Status: AC
  Administered 2017-08-26: 30 mg
  Filled 2017-08-26: qty 1

## 2017-08-26 MED ORDER — PHENYLEPHRINE 8 MG IN D5W 100 ML (0.08MG/ML) PREMIX OPTIME
INJECTION | INTRAVENOUS | Status: AC
Start: 2017-08-26 — End: 2017-08-26
  Filled 2017-08-26: qty 100

## 2017-08-26 MED ORDER — SIMETHICONE 80 MG PO CHEW
80.0000 mg | CHEWABLE_TABLET | Freq: Three times a day (TID) | ORAL | Status: DC
  Administered 2017-08-27 – 2017-08-28 (×4): 80 mg via ORAL
  Filled 2017-08-26 (×5): qty 1

## 2017-08-26 MED ORDER — ONDANSETRON HCL 4 MG/2ML IJ SOLN
INTRAMUSCULAR | Status: AC
  Filled 2017-08-26: qty 2

## 2017-08-26 MED ORDER — OXYCODONE HCL 5 MG PO TABS
5.0000 mg | ORAL_TABLET | ORAL | Status: DC | PRN
  Administered 2017-08-27 (×3): 5 mg via ORAL
  Filled 2017-08-26 (×3): qty 1

## 2017-08-26 MED ORDER — FENTANYL CITRATE (PF) 100 MCG/2ML IJ SOLN
INTRAMUSCULAR | Status: DC | PRN
  Administered 2017-08-26: 50 ug via INTRAVENOUS
  Administered 2017-08-26: 10 ug via INTRATHECAL
  Administered 2017-08-26: 50 ug via INTRAVENOUS
  Administered 2017-08-26: 40 ug via INTRAVENOUS

## 2017-08-26 MED ORDER — DEXAMETHASONE SODIUM PHOSPHATE 4 MG/ML IJ SOLN
INTRAMUSCULAR | Status: DC | PRN
  Administered 2017-08-26: 4 mg via INTRAVENOUS

## 2017-08-26 MED ORDER — IBUPROFEN 600 MG PO TABS: 600.0000 mg | ORAL_TABLET | Freq: Four times a day (QID) | ORAL | Status: DC

## 2017-08-26 MED ORDER — COCONUT OIL OIL: 1.0000 "application " | TOPICAL_OIL | Status: DC | PRN

## 2017-08-26 MED ORDER — LACTATED RINGERS IV SOLN
INTRAVENOUS | Status: DC
  Administered 2017-08-27: 01:00:00 via INTRAVENOUS

## 2017-08-26 MED ORDER — PHENYLEPHRINE 40 MCG/ML (10ML) SYRINGE FOR IV PUSH (FOR BLOOD PRESSURE SUPPORT)
PREFILLED_SYRINGE | INTRAVENOUS | Status: AC
  Filled 2017-08-26: qty 10

## 2017-08-26 MED ORDER — LACTATED RINGERS IV SOLN
INTRAVENOUS | Status: DC | PRN
  Administered 2017-08-26: 15:00:00 via INTRAVENOUS

## 2017-08-26 MED ORDER — DIBUCAINE 1 % RE OINT: 1.0000 "application " | TOPICAL_OINTMENT | RECTAL | Status: DC | PRN

## 2017-08-26 MED ORDER — KETOROLAC TROMETHAMINE 30 MG/ML IJ SOLN
INTRAMUSCULAR | Status: DC | PRN
  Administered 2017-08-26: 30 mg via INTRAVENOUS

## 2017-08-26 MED ORDER — OXYTOCIN 10 UNIT/ML IJ SOLN
INTRAMUSCULAR | Status: DC | PRN
  Administered 2017-08-26: 40 [IU] via INTRAVENOUS

## 2017-08-26 MED ORDER — PROMETHAZINE HCL 25 MG/ML IJ SOLN
6.2500 mg | INTRAMUSCULAR | Status: DC | PRN
  Administered 2017-08-26: 12.5 mg via INTRAVENOUS

## 2017-08-26 MED ORDER — MORPHINE SULFATE (PF) 4 MG/ML IV SOLN
1.0000 mg | INTRAVENOUS | Status: DC | PRN
  Administered 2017-08-26: 2 mg via INTRAVENOUS

## 2017-08-26 MED ORDER — MORPHINE SULFATE (PF) 0.5 MG/ML IJ SOLN
INTRAMUSCULAR | Status: AC
  Filled 2017-08-26: qty 10

## 2017-08-26 SURGICAL SUPPLY — 43 items
BENZOIN TINCTURE PRP APPL 2/3 (GAUZE/BANDAGES/DRESSINGS) ×3 IMPLANT
CHLORAPREP W/TINT 26ML (MISCELLANEOUS) ×3 IMPLANT
CLAMP CORD UMBIL (MISCELLANEOUS) IMPLANT
CLIP FILSHIE TUBAL LIGA STRL (Clip) ×3 IMPLANT
CLOSURE STERI STRIP 1/2 X4 (GAUZE/BANDAGES/DRESSINGS) ×3 IMPLANT
CLOTH BEACON ORANGE TIMEOUT ST (SAFETY) ×3 IMPLANT
DRAPE C SECTION CLR SCREEN (DRAPES) IMPLANT
DRSG OPSITE POSTOP 4X10 (GAUZE/BANDAGES/DRESSINGS) ×3 IMPLANT
ELECT REM PT RETURN 9FT ADLT (ELECTROSURGICAL) ×3
ELECTRODE REM PT RTRN 9FT ADLT (ELECTROSURGICAL) ×2 IMPLANT
EXTRACTOR VACUUM M CUP 4 TUBE (SUCTIONS) IMPLANT
GAUZE SPONGE 4X4 12PLY STRL LF (GAUZE/BANDAGES/DRESSINGS) ×6 IMPLANT
GLOVE BIO SURGEON STRL SZ7.5 (GLOVE) ×3 IMPLANT
GLOVE BIOGEL PI IND STRL 7.0 (GLOVE) ×2 IMPLANT
GLOVE BIOGEL PI INDICATOR 7.0 (GLOVE) ×1
GOWN STRL REUS W/TWL 2XL LVL3 (GOWN DISPOSABLE) ×3 IMPLANT
GOWN STRL REUS W/TWL LRG LVL3 (GOWN DISPOSABLE) ×6 IMPLANT
KIT ABG SYR 3ML LUER SLIP (SYRINGE) IMPLANT
NEEDLE HYPO 22GX1.5 SAFETY (NEEDLE) ×3 IMPLANT
NEEDLE HYPO 25X5/8 SAFETYGLIDE (NEEDLE) IMPLANT
NS IRRIG 1000ML POUR BTL (IV SOLUTION) ×3 IMPLANT
PACK C SECTION WH (CUSTOM PROCEDURE TRAY) ×3 IMPLANT
PAD ABD 7.5X8 STRL (GAUZE/BANDAGES/DRESSINGS) ×3 IMPLANT
PAD OB MATERNITY 4.3X12.25 (PERSONAL CARE ITEMS) ×3 IMPLANT
PENCIL SMOKE EVAC W/HOLSTER (ELECTROSURGICAL) ×3 IMPLANT
RTRCTR C-SECT PINK 25CM LRG (MISCELLANEOUS) ×3 IMPLANT
SPONGE LAP 18X18 X RAY DECT (DISPOSABLE) ×6 IMPLANT
STRIP CLOSURE SKIN 1/2X4 (GAUZE/BANDAGES/DRESSINGS) ×3 IMPLANT
SUT CHROMIC 1 CTX 36 (SUTURE) ×6 IMPLANT
SUT PLAIN 2 0 XLH (SUTURE) ×3 IMPLANT
SUT VIC AB 1 CT1 27 (SUTURE) ×2
SUT VIC AB 1 CT1 27XBRD ANTBC (SUTURE) ×4 IMPLANT
SUT VIC AB 2-0 CT1 (SUTURE) ×3 IMPLANT
SUT VIC AB 2-0 CT1 27 (SUTURE) ×1
SUT VIC AB 2-0 CT1 TAPERPNT 27 (SUTURE) ×2 IMPLANT
SUT VIC AB 3-0 CT1 27 (SUTURE) ×2
SUT VIC AB 3-0 CT1 TAPERPNT 27 (SUTURE) ×4 IMPLANT
SUT VIC AB 3-0 SH 27 (SUTURE)
SUT VIC AB 3-0 SH 27X BRD (SUTURE) IMPLANT
SUT VIC AB 4-0 KS 27 (SUTURE) ×3 IMPLANT
SYR BULB IRRIGATION 50ML (SYRINGE) IMPLANT
TOWEL OR 17X24 6PK STRL BLUE (TOWEL DISPOSABLE) ×3 IMPLANT
TRAY FOLEY BAG SILVER LF 14FR (SET/KITS/TRAYS/PACK) ×3 IMPLANT

## 2017-08-26 NOTE — Transfer of Care (Signed)
Immediate Anesthesia Transfer of Care Note  Patient: Jenna Patterson  Procedure(s) Performed: Procedure(s): REPEAT CESAREAN SECTION (N/A) BILATERAL TUBAL LIGATION (Bilateral)  Patient Location: PACU  Anesthesia Type:Spinal  Level of Consciousness: awake, alert , oriented and patient cooperative  Airway & Oxygen Therapy: Patient Spontanous Breathing  Post-op Assessment: Report given to RN and Post -op Vital signs reviewed and stable  Post vital signs: Reviewed and stable  Last Vitals:  Vitals:   08/26/17 1231  BP: 137/72  Pulse: 89  Resp: 16  Temp: 37 C    Last Pain:  Vitals:   08/26/17 1231  TempSrc: Oral         Complications: No apparent anesthesia complications

## 2017-08-26 NOTE — Plan of Care (Signed)
Problem: Activity: Goal: Will verbalize the importance of balancing activity with adequate rest periods Encouraged patient not to ambulate without staff assistance until otherwise instructed.   Problem: Education: Goal: Knowledge of condition will improve Admission education, safety and unit protocols reviewed with patient and significant other.

## 2017-08-26 NOTE — H&P (Signed)
Jenna Patterson is a 22 y.L.E7N1700 IUP 40 weeks female presenting for RLTCS and BTL. Pt reports + FM, occ ut ctx, no VB or LOF. Prenatal care at [facility]. Complicated by prior c section, AMA and desire for BTL. Low risk NIPS.   OB History    Gravida Para Term Preterm AB Living   3 1 1  0 1 1   SAB TAB Ectopic Multiple Live Births   1 0 0 0 1     Past Medical History:  Diagnosis Date  . Hypothyroidism   . Kidney stone   . LGA (large for gestational age) fetus affecting management of mother 08/09/2017   At 17 weeks (08/09/2017): Estimated fetal weight is 3235g which is growth in the 83rd  percentile. AC is in the 95th percentile [ ]  repeat scan in 4 weeks  . Mitral valve regurgitation    trace amount by echo in 2013  . Normal cardiac stress test    EF 65%, no ischemia 05/09/2012 at [place]  . Palpitation   . Thyroid disease   . Ventricular hypertrophy left   mild LVH seen on previous echo on 04/18/2012 at [place] in [facility]   Past Surgical History:  Procedure Laterality Date  . CESAREAN SECTION    . DILATION AND CURETTAGE OF UTERUS     Family History: family history includes CAD in her father; Heart attack in her paternal grandfather; Thyroid disease in her father, mother, and sister. Social History:  reports that she has never smoked. She has never used smokeless tobacco. She reports that she does not drink alcohol or use drugs.     Maternal Diabetes: No Genetic Screening: Normal Maternal Ultrasounds/Referrals: Normal Fetal Ultrasounds or other Referrals:  None Maternal Substance Abuse:  No Significant Maternal Medications:  None Significant Maternal Lab Results:  None Other Comments:  None  Review of Systems  Constitutional: Negative.   Cardiovascular: Negative.   Gastrointestinal: Negative.   Genitourinary: Negative.    History   Last menstrual period 11/25/2016. Exam Physical Exam  Constitutional: She appears well-developed and  well-nourished.  Cardiovascular: Normal rate and regular rhythm.   Respiratory: Effort normal and breath sounds normal.  GI: Soft. Bowel sounds are normal.  Gravid, S=D    Prenatal labs: ABO, Rh: --/--/O POS (09/06 1749) Antibody: NEG (09/06 0940) Rubella: 7.05 (02/01 1509) RPR: Non Reactive (09/06 0943)  HBsAg: NEGATIVE (02/01 1509)  HIV: NONREACTIVE (02/01 1509)  GBS:     Assessment/Plan: IUP 39 weeks Prior c section desire for repeat Desire for BTL  Admit for RLTCS and BTL. R/B/Post op care and failure rate of tubal reviewed with pt. Pt verbalized understanding and agrees to proceed.    Chancy Milroy 08/26/2017, 10:19 AM

## 2017-08-26 NOTE — Op Note (Signed)
Operative Report  Jenna Patterson  PROCEDURE DATE: 08/26/2017  PREOPERATIVE DIAGNOSES: Intrauterine pregnancy at [redacted]w[redacted]d weeks gestation; Previous Cesarean Section;  Undesired fertility  POSTOPERATIVE DIAGNOSES: The same  PROCEDURE: Repeat Low Transverse Cesarean Section; Bilateral Tubal Sterilization using Filshie clips  SURGEON:   Surgeon(s) and Role:    * Chancy Milroy, MD - Primary - Attending    * Crissie Figures, MD - OB Fellow  ASSISTANT:  Crissie Figures, MD - OB Fellow   INDICATIONS: Jenna Patterson is a 36 y.o. G3P1011 at [redacted]w[redacted]d here for cesarean section secondary to the indications listed under preoperative diagnoses; please see preoperative note for further details.  The risks of cesarean section were discussed with the patient including but were not limited to: bleeding which may require transfusion or reoperation; infection which may require antibiotics; injury to bowel, bladder, ureters or other surrounding organs; injury to the fetus; need for additional procedures including hysterectomy in the event of a life-threatening hemorrhage; placental abnormalities wth subsequent pregnancies, incisional problems, thromboembolic phenomenon and other postoperative/anesthesia complications.   The patient concurred with the proposed plan, giving informed written consent for the procedure.    FINDINGS:  Viable female infant in cephalic presentation.  Apgars 8 and 8.  Clear amniotic fluid.  Intact placenta, three vessel cord.  Normal uterus, fallopian tubes and ovaries bilaterally.  ANESTHESIA: Spinal INTRAVENOUS FLUIDS: 1700 ml ESTIMATED BLOOD LOSS: 788 ml URINE OUTPUT:  450 ml SPECIMENS: Placenta sent to L&D COMPLICATIONS: None immediate  PROCEDURE IN DETAIL:  The patient preoperatively received intravenous antibiotics and had sequential compression devices applied to her lower extremities.  She was then taken to the operating room where spinal anesthesia was administered and was found to  be adequate. She was then placed in a dorsal supine position with a leftward tilt, and prepped and draped in a sterile manner.  A foley catheter was placed into her bladder and attached to constant gravity.    After an adequate timeout was performed, a Pfannenstiel skin incision was made with scalpel over her preexisting scar and carried through to the underlying layer of fascia. The fascia was incised in the midline, and this incision was extended bilaterally using the Mayo scissors.  Kocher clamps were applied to the superior aspect of the fascial incision and the underlying rectus muscles were dissected off bluntly.  A similar process was carried out on the inferior aspect of the fascial incision. The rectus muscles were separated in the midline bluntly and the peritoneum was entered bluntly. Attention was turned to the lower uterine segment where a low transverse hysterotomy was made with a scalpel and extended bilaterally bluntly.  The infant was successfully delivered, the cord was clamped and cut after one minute, and the infant was handed over to the awaiting neonatology team. Uterine massage was then administered, and the placenta delivered intact with a three-vessel cord. The uterus was then cleared of clots and debris.  The hysterotomy was closed with Chromic in a running locked fashion in one layer. Attention was then turned to the fallopian tubes, and Filshie clips were placed about 3 cm from the cornua, with care given to incorporate the underlying mesosalpinx on both sides, allowing for bilateral tubal sterilization. The pelvis was cleared of all clot and debris. Hemostasis was confirmed on all surfaces.  rectus muscles were reapproximated using Chromic interrupted stitches. The fascia was then closed using 0 Vicryl in a running fashion.  The subcutaneous layer was irrigated, then reapproximated with 2-0 plain  gut interrupted stitches.  The skin was closed with a 4-0 Vicryl subcuticular stitch.    The patient tolerated the procedure well. Sponge, lap, instrument and needle counts were correct x 3.  She was taken to the recovery room in stable condition.   Disposition: PACU - hemodynamically stable.   Maternal Condition: stable    Signed: Gailen Shelter, MD OB Fellow 08/26/2017 4:09 PM  I was present and scrubbed in for the enterity of the case Arlina Robes, MD

## 2017-08-26 NOTE — Anesthesia Postprocedure Evaluation (Signed)
Anesthesia Post Note  Patient: Jenna Patterson  Procedure(s) Performed: Procedure(s) (LRB): REPEAT CESAREAN SECTION (N/A) BILATERAL TUBAL LIGATION (Bilateral)     Patient location during evaluation: PACU Anesthesia Type: Spinal Level of consciousness: awake and alert and oriented Pain management: pain level controlled Vital Signs Assessment: post-procedure vital signs reviewed and stable Respiratory status: spontaneous breathing, nonlabored ventilation and respiratory function stable Cardiovascular status: blood pressure returned to baseline and stable Postop Assessment: no headache and no backache Anesthetic complications: no    Last Vitals:  Vitals:   08/26/17 1705 08/26/17 1706  BP:    Pulse: 80 77  Resp: (!) 22 18  Temp:    SpO2: 99% 99%    Last Pain:  Vitals:   08/26/17 1700  TempSrc:   PainSc: 5    Pain Goal:                 Bonna Gains

## 2017-08-26 NOTE — Anesthesia Preprocedure Evaluation (Signed)
Anesthesia Evaluation  Patient identified by MRN, date of birth, ID band Patient awake    Reviewed: Allergy & Precautions, NPO status , Patient's Chart, lab work & pertinent test results  History of Anesthesia Complications Negative for: history of anesthetic complications  Airway Mallampati: II  TM Distance: >3 FB Neck ROM: Full    Dental  (+) Dental Advisory Given   Pulmonary neg pulmonary ROS,    breath sounds clear to auscultation       Cardiovascular negative cardio ROS   Rhythm:Regular Rate:Normal  H/o murmur:  '13 ECHO: EF 65-70%, valves OK   Neuro/Psych negative neurological ROS     GI/Hepatic negative GI ROS, Neg liver ROS,   Endo/Other  Morbid obesity  Renal/GU negative Renal ROS     Musculoskeletal   Abdominal (+) + obese,   Peds  Hematology plt 324k   Anesthesia Other Findings   Reproductive/Obstetrics (+) Pregnancy                             Anesthesia Physical Anesthesia Plan  ASA: II  Anesthesia Plan: Spinal   Post-op Pain Management:    Induction:   PONV Risk Score and Plan: 2 and Ondansetron and Treatment may vary due to age or medical condition  Airway Management Planned: Natural Airway  Additional Equipment:   Intra-op Plan:   Post-operative Plan:   Informed Consent: I have reviewed the patients History and Physical, chart, labs and discussed the procedure including the risks, benefits and alternatives for the proposed anesthesia with the patient or authorized representative who has indicated his/her understanding and acceptance.   Dental advisory given  Plan Discussed with: CRNA and Surgeon  Anesthesia Plan Comments: (Plan routine monitors, SAB)        Anesthesia Quick Evaluation

## 2017-08-26 NOTE — Anesthesia Procedure Notes (Signed)
Spinal  Patient location during procedure: OR End time: 08/26/2017 2:47 PM Staffing Anesthesiologist: Annye Asa Performed: anesthesiologist  Preanesthetic Checklist Completed: patient identified, surgical consent, pre-op evaluation, timeout performed, IV checked, risks and benefits discussed and monitors and equipment checked Spinal Block Patient position: sitting Prep: site prepped and draped and DuraPrep Patient monitoring: blood pressure, continuous pulse ox and heart rate Approach: midline Location: L3-4 Injection technique: single-shot Needle Needle type: Pencan  Needle gauge: 24 G Needle length: 9 cm Additional Notes Pt identified in Operating room.  Monitors applied. Working IV access confirmed. Sterile prep, drape lumbar spine.  1% lido local L 3,4.  #24ga Quincke into clear CSF L 3,4.  9mg  0.75% Bupivacaine with dextrose, morphine, fentanyl injected with asp CSF beginning and end of injection.  Patient asymptomatic, VSS, no heme aspirated, tolerated well.  Jenita Seashore, MD

## 2017-08-27 LAB — CBC
HEMATOCRIT: 28.4 % — AB (ref 36.0–46.0)
HEMOGLOBIN: 9.7 g/dL — AB (ref 12.0–15.0)
MCH: 28.3 pg (ref 26.0–34.0)
MCHC: 34.2 g/dL (ref 30.0–36.0)
MCV: 82.8 fL (ref 78.0–100.0)
Platelets: 279 10*3/uL (ref 150–400)
RBC: 3.43 MIL/uL — AB (ref 3.87–5.11)
RDW: 13.4 % (ref 11.5–15.5)
WBC: 18.6 10*3/uL — AB (ref 4.0–10.5)

## 2017-08-27 LAB — BIRTH TISSUE RECOVERY COLLECTION (PLACENTA DONATION)

## 2017-08-27 NOTE — Lactation Note (Signed)
This note was copied from a baby's chart. Lactation Consultation Note  Patient Name: Jenna Patterson [id number] Date: 08/27/2017 Reason for consult: Initial assessment;Maternal endocrine disorder  Baby 6 hours old. Mom reports that it takes a few minutes for baby to latch, but once latched, baby nurses well. Mom states that she is seeing EBM when she hand expresses and she is hearing swallows while baby at breast. Enc mom to call for assistance with latching when baby ready to nurse. Discussed positioning and mom given spoons for hand expressing and spoon-feeding as well. Mom states that she is hypothyroid, on medication, and understands importance of staying within range--hormones--d/t impact on breast milk supply. Mom given [facility] brochure, aware of OP/BFSG and [place] phone line assistance after D/C.   Maternal Data Has patient been taught Hand Expression?: Yes Does the patient have breastfeeding experience prior to this delivery?: No  Feeding    LATCH Score Latch: Grasps breast easily, tongue down, lips flanged, rhythmical sucking.  Audible Swallowing: A few with stimulation  Type of Nipple: Everted at rest and after stimulation  Comfort (Breast/Nipple): Soft / non-tender  Hold (Positioning): No assistance needed to correctly position infant at breast.  LATCH Score: 9  Interventions Interventions: Breast feeding basics reviewed;Skin to skin;Position options  Lactation Tools Discussed/Used     Consult Status Consult Status: Follow-up Date: 08/28/17 Follow-up type: In-patient    Andres Labrum 08/27/2017, 12:14 PM

## 2017-08-27 NOTE — Progress Notes (Signed)
Subjective: Postpartum Day #2: Cesarean Delivery Patient reports tolerating PO.  Foley just removed- has not voided yet. Amb to bathroom during the night without difficulty. Breastfeeding going well.  Objective: Vital signs in last 24 hours: Temp:  [97.6 F (36.4 C)-98.6 F (37 C)] 98.2 F (36.8 C) (09/08 0700) Pulse Rate:  [67-90] 70 (09/08 0700) Resp:  [13-26] 18 (09/08 0700) BP: (95-137)/(50-76) 117/57 (09/08 0700) SpO2:  [94 %-100 %] 97 % (09/08 0700) Weight:  [112 kg (247 lb)] 112 kg (247 lb) (09/07 1220)  Physical Exam:  General: alert, cooperative and no distress Lochia: appropriate Uterine Fundus: firm Incision: pressure dsg intact/dry DVT Evaluation: No evidence of DVT seen on physical exam.   Recent Labs  08/25/17 0943 08/27/17 0517  HGB 11.9* 9.7*  HCT 35.0* 28.4*    Assessment/Plan: Status post Cesarean section. Doing well postoperatively.  Continue current care. Anticipate d/c in AM 08/28/17.  Serita Grammes CNM 08/27/2017, 7:47 AM

## 2017-08-28 MED ORDER — DEXTROSE 5 % IV SOLN: 2.0000 g | INTRAVENOUS | Status: DC

## 2017-08-28 MED ORDER — OXYCODONE HCL 5 MG PO TABS: 5.0000 mg | ORAL_TABLET | ORAL | 0 refills | Status: DC | PRN

## 2017-08-28 MED ORDER — SENNOSIDES-DOCUSATE SODIUM 8.6-50 MG PO TABS: 2.0000 | ORAL_TABLET | ORAL | 1 refills | Status: DC

## 2017-08-28 MED ORDER — IBUPROFEN 600 MG PO TABS: 600.0000 mg | ORAL_TABLET | Freq: Four times a day (QID) | ORAL | 0 refills | Status: DC

## 2017-08-28 NOTE — Lactation Note (Signed)
This note was copied from a baby's chart. Lactation Consultation Note  Patient Name: Jenna Patterson [id number] Date: 08/28/2017 Reason for consult: Follow-up assessment Baby at 38 hr of life and dyad set for d/c today. Mom is worried "I am not doing this right". Mom hs large soft breast with everted nipples. She has bilateral pea sized scabs on the center of he nipple surfaces. She was leaving baby swaddled in 2 blankets and lifting the breast up/over to let him just grab the tip of the nipple. Showed parents how to place the baby in cross cradle and compress the breast to him to get a deeper latch. Mom reports this feeding felt better but was still sore. Demonstrated manual expression, large drops of easily expressed colostrum noted bilaterally. Given comfort gels. Discussed baby behavior, feeding frequency, baby belly size, voids, wt loss, breast changes, and nipple care. Parents are aware of lactation services and support group. Encourage mom to f/u with lactation on 08/31/17. She has a DEBP that she knows how to use.    Maternal Data    Feeding Feeding Type: Breast Fed Length of feed: 15 min  LATCH Score Latch: Grasps breast easily, tongue down, lips flanged, rhythmical sucking.  Audible Swallowing: A few with stimulation  Type of Nipple: Everted at rest and after stimulation  Comfort (Breast/Nipple): Engorged, cracked, bleeding, large blisters, severe discomfort  Hold (Positioning): Assistance needed to correctly position infant at breast and maintain latch.  LATCH Score: 6  Interventions Interventions: Breast feeding basics reviewed;Assisted with latch;Skin to skin;Breast massage;Hand express;Adjust position;Support pillows;Coconut oil;Comfort gels  Lactation Tools Discussed/Used     Consult Status Consult Status: Complete Date: 08/31/17 Follow-up type: [place] 08/28/2017, 11:40 AM

## 2017-08-28 NOTE — Discharge Summary (Signed)
OB Discharge Summary  Patient Name: Jenna Patterson DOB: 10-Jun-1981 MRN: [id number]  Date of admission: 08/26/2017 Delivering MD: Chancy Milroy   Date of discharge: 08/28/2017  Admitting diagnosis: RCS Intrauterine pregnancy: [redacted]w[redacted]d     Secondary diagnosis:Active Problems:   Status post repeat low transverse cesarean section  Additional problems:none     Discharge diagnosis: Term Pregnancy Delivered                                                                     Post partum procedures:n/a  Augmentation: n/a  Complications: None  Hospital course:  Sceduled C/S   36 y.o. yo G3P1011 at [redacted]w[redacted]d was admitted to the hospital 08/26/2017 for scheduled cesarean section with the following indication:Elective Repeat and BTL.  Membrane Rupture Time/Date: 3:12 PM ,08/26/2017   Patient delivered a Viable infant.08/26/2017  Details of operation can be found in separate operative note.  Pateint had an uncomplicated postpartum course.  She is ambulating, tolerating a regular diet, passing flatus, and urinating well. Patient is discharged home in stable condition on  08/28/17         Physical exam  Vitals:   08/27/17 1037 08/27/17 1429 08/27/17 1830 08/28/17 0407  BP: (!) 100/53 (!) 111/57 (!) 109/48 106/61  Pulse: 80 80 81 70  Resp: 18 18 18 18   Temp: 98.1 F (36.7 C) 98.5 F (36.9 C) (!) 97.5 F (36.4 C) (!) 96.3 F (35.7 C)  TempSrc: Oral Oral Oral Axillary  SpO2: 95% 97%    Weight:      Height:       General: alert, cooperative and no distress Lochia: appropriate Uterine Fundus: firm Incision: Healing well with no significant drainage, No significant erythema, Dressing is clean, dry, and intact DVT Evaluation: No evidence of DVT seen on physical exam. Labs: Lab Results  Component Value Date   WBC 18.6 (H) 08/27/2017   HGB 9.7 (L) 08/27/2017   HCT 28.4 (L) 08/27/2017   MCV 82.8 08/27/2017   PLT 279 08/27/2017   CMP Latest Ref Rng & Units 04/04/2015  Glucose 70 - 99  mg/dL 111(H)  BUN 6 - 23 mg/dL 7  Creatinine 0.50 - 1.10 mg/dL 0.55  Sodium 135 - 145 mmol/L 137  Potassium 3.5 - 5.1 mmol/L 3.2(L)  Chloride 96 - 112 mmol/L 102  CO2 19 - 32 mmol/L 23  Calcium 8.4 - 10.5 mg/dL 9.3    Discharge instruction: per After Visit Summary and "Baby and Me Booklet".  After Visit Meds:  Allergies as of 08/28/2017   No Known Allergies     Medication List    STOP taking these medications   aspirin EC 81 MG tablet     TAKE these medications   calcium-vitamin D 500-200 MG-UNIT tablet Commonly known as:  OSCAL 500/200 D-3 Take 1 tablet by mouth 2 (two) times daily.   ibuprofen 600 MG tablet Commonly known as:  ADVIL,MOTRIN Take 1 tablet (600 mg total) by mouth every 6 (six) hours.   levothyroxine 175 MCG tablet Commonly known as:  SYNTHROID Take 1 tablet (175 mcg total) by mouth daily before breakfast.   multivitamin-prenatal 27-0.8 MG Tabs tablet Take 1 tablet by mouth daily.   oxyCODONE 5 MG  immediate release tablet Commonly known as:  Oxy IR/ROXICODONE Take 1 tablet (5 mg total) by mouth every 4 (four) hours as needed (pain scale 4-7).   senna-docusate 8.6-50 MG tablet Commonly known as:  Senokot-S Take 2 tablets by mouth daily.            Discharge Care Instructions        Start     Ordered   08/29/17 0000  senna-docusate (SENOKOT-S) 8.6-50 MG tablet  Every 24 hours     08/28/17 0838   08/28/17 0000  ibuprofen (ADVIL,MOTRIN) 600 MG tablet  Every 6 hours     08/28/17 0838   08/28/17 0000  oxyCODONE (OXY IR/ROXICODONE) 5 MG immediate release tablet  Every 4 hours PRN     08/28/17 0838      Diet: routine diet  Activity: Advance as tolerated. Pelvic rest for 6 weeks.   Outpatient follow up:6 weeks Follow up Appt:No future appointments. Follow up visit: No Follow-up on file.  Postpartum contraception: Tubal Ligation  Newborn Data: Live born female  Birth Weight: 7 lb 9.9 oz (3455 g) APGAR: 8, 8  Baby Feeding:  Breast Disposition:home with mother   08/28/2017 Koren Shiver, CNM

## 2017-08-31 ENCOUNTER — Encounter: Payer: Self-pay | Admitting: General Practice

## 2017-10-02 ENCOUNTER — Other Ambulatory Visit: Payer: Self-pay | Admitting: Obstetrics and Gynecology

## 2017-10-03 ENCOUNTER — Ambulatory Visit (INDEPENDENT_AMBULATORY_CARE_PROVIDER_SITE_OTHER): Admitting: Advanced Practice Midwife

## 2017-10-03 DIAGNOSIS — R87610 Atypical squamous cells of undetermined significance on cytologic smear of cervix (ASC-US): Secondary | ICD-10-CM

## 2017-10-03 DIAGNOSIS — E039 Hypothyroidism, unspecified: Secondary | ICD-10-CM

## 2017-10-03 DIAGNOSIS — Z1389 Encounter for screening for other disorder: Secondary | ICD-10-CM | POA: Diagnosis not present

## 2017-10-03 MED ORDER — VITAMIN D (ERGOCALCIFEROL) 1.25 MG (50000 UNIT) PO CAPS: 50000.0000 [IU] | ORAL_CAPSULE | ORAL | Status: AC

## 2017-10-03 MED ORDER — LEVOTHYROXINE SODIUM 175 MCG PO TABS: 175.0000 ug | ORAL_TABLET | Freq: Every day | ORAL | 4 refills | Status: DC

## 2017-10-03 NOTE — Progress Notes (Signed)
Subjective:     Jenna Patterson is a 36 y.o. female who presents for a postpartum visit. She is 5 weeks postpartum following a low cervical transverse Cesarean section. I have fully reviewed the prenatal and intrapartum course. The delivery was at 65 gestational weeks. Outcome: repeat cesarean section, low transverse incision. Anesthesia: spinal. Postpartum course has been uncomplicated. Baby's course has been uncomplicated. Baby is feeding by both breast and bottle - Carnation Good Start. Bleeding no bleeding. Bowel function is normal. Bladder function is normal. Patient is not sexually active. Contraception method is tubal ligation. Postpartum depression screening: negative.  The following portions of the patient's history were reviewed and updated as appropriate: allergies, current medications, past family history, past medical history, past social history, past surgical history and problem list.  Patient reports that she is nursing, and then giving bottles afterward. She would like suggestions for increasing milk supply.  Review of Systems Pertinent items are noted in HPI.   Objective:    LMP 11/25/2016 (Exact Date)   General:  alert, cooperative and appears stated age   Breasts:  inspection negative, no nipple discharge or bleeding, no masses or nodularity palpable  Lungs: clear to auscultation bilaterally  Heart:  regular rate and rhythm, S1, S2 normal, no murmur, click, rub or gallop  Abdomen: soft, non-tender; bowel sounds normal; no masses,  no organomegaly   Vulva:  not evaluated  Vagina: not evaluated  Cervix:  NA  Corpus: not examined  Adnexa:  not evaluated  Rectal Exam: Not performed.        Assessment:    normal 5 week postpartum exam. Pap smear not done at today's visit.   Plan:    1. Contraception: tubal ligation 2. TSH today, will refill levothyroxine 3. Follow up in: 1 month or as needed.

## 2017-10-03 NOTE — Patient Instructions (Addendum)
Pump for 15 mins after each feeding Start fenugreek 2 tabs 4 times per day, sweat and urine will smell like maple syrup If no change in milk supply within 3-4 days then call for a lactation consult appointment   Colposcopy Colposcopy is a procedure to examine the lowest part of the uterus (cervix), the vagina, and the area around the vaginal opening (vulva) for abnormalities or signs of disease. The procedure is done using a lighted microscope or magnifying lens (colposcope). If any unusual cells are found during the procedure, your health care provider may remove a tissue sample for testing (biopsy). A colposcopy may be done if you:  Have an abnormal Pap test. A Pap test is a screening test that is used to check for signs of cancer or infection of the vagina, cervix, and uterus.  Have a Pap smear test in which you test positive for high-risk HPV (human papillomavirus).  Have a sore or lesion on your cervix.  Have genital warts on your vulva, vagina, or cervix.  Took certain medicines while pregnant, such as diethylstilbestrol (DES).  Have pain during sexual intercourse.  Have vaginal bleeding, especially after sexual intercourse.  Need to have a cervical polyp removed.  Need to have a lost intrauterine device (IUD) string located.  Let your health care provider know about:  Any allergies you have, including allergies to prescribed medicine, latex, or iodine.  All medicines you are taking, including vitamins, herbs, eye drops, creams, and over-the-counter medicines. Bring a list of all of your medicines to your appointment.  Any problems you or family members have had with anesthetic medicines.  Any blood disorders you have.  Any surgeries you have had.  Any medical conditions you have, such as pelvic inflammatory disease (PID) or endometrial disorder.  Any history of frequent fainting.  Your menstrual cycle and what form of birth control (contraception) you use.  Your  medical history, including any prior cervical treatment.  Whether you are pregnant or may be pregnant. What are the risks? Generally, this is a safe procedure. However, problems may occur, including:  Pain.  Infection, which may include a fever, bad-smelling discharge, or pelvic pain.  Bleeding or discharge.  Misdiagnosis.  Fainting and vasovagal reactions, but this is rare.  Allergic reactions to medicines.  Damage to other structures or organs.  What happens before the procedure?  If you have your menstrual period or will have it at the time of your procedure, tell your health care provider. A colposcopy typically is not done during menstruation.  Continue your contraceptive practices before and after the procedure.  For 24 hours before the colposcopy: ? Do not douche. ? Do not use tampons. ? Do not use medicines, creams, or suppositories in the vagina. ? Do not have sexual intercourse.  Ask your health care provider about: ? Changing or stopping your regular medicines. This is especially important if you are taking diabetes medicines or blood thinners. ? Taking medicines such as aspirin and ibuprofen. These medicines can thin your blood. Do not take these medicines before your procedure if your health care provider instructs you not to. It is likely that your health care provider will tell you to avoid taking aspirin or medicine that contains aspirin for 7 days before the procedure.  Follow instructions from your health care provider about eating or drinking restrictions. You will likely need to eat a regular diet the day of the procedure and not skip any meals.  You may have an exam  or testing. A pregnancy test will be taken on the day of the procedure.  You may have a blood or urine sample taken.  Plan to have someone take you home from the hospital or clinic.  If you will be going home right after the procedure, plan to have someone with you for 24 hours. What  happens during the procedure?  You will lie down on your back, with your feet in foot rests (stirrups).  A warmed and lubricated instrument (speculum) will be inserted into your vagina. The speculum will be used to hold apart the walls of your vagina so your health care provider can see your cervix and the inside of your vagina.  A cotton swab will be used to place a small amount of liquid solution on the areas to be examined. This solution makes it easier to see abnormal cells. You may feel a slight burning during this part.  The colposcope will be used to scan the cervix with a bright white light. The colposcope will be held near your vulvaand will magnify your vulva, vagina, and cervix for easier examination.  Your health care provider may decide to take a biopsy. If so: ? You may be given medicine to numb the area (local anesthetic). ? Surgical instruments will be used to suck out mucus and cells through your vagina. ? You may feel mild pain while the tissue sample is removed. ? Bleeding may occur. A solution may be used to stop the bleeding. ? If a sample of tissue is needed from the inside of the cervix, a different procedure called endocervical curettage (ECC) may be completed. During this procedure, a curved instrument (curette) will be used to scrape cells from your cervix or the top of your cervix (endocervix).  Your health care provider will record the location of any abnormalities. The procedure may vary among health care providers and hospitals. What happens after the procedure?  You will lie down and rest for a few minutes. You may be offered juice or cookies.  Your blood pressure, heart rate, breathing rate, and blood oxygen level will be monitored until any medicines you were given have worn off.  You may have to wear compression stockings. These stockings help to prevent blood clots and reduce swelling in your legs.  You may have some cramping in your abdomen. This should  go away after a few minutes. This information is not intended to replace advice given to you by your health care provider. Make sure you discuss any questions you have with your health care provider. Document Released: 02/26/2003 Document Revised: 08/03/2016 Document Reviewed: 07/12/2016 Elsevier Interactive Patient Education  [employer].

## 2017-10-04 LAB — TSH: TSH: 0.026 u[IU]/mL — ABNORMAL LOW (ref 0.450–4.500)

## 2017-10-24 ENCOUNTER — Other Ambulatory Visit (HOSPITAL_COMMUNITY)
Admission: RE | Admit: 2017-10-24 | Discharge: 2017-10-24 | Disposition: A | Discharge status: Home / Self Care | Source: Ambulatory Visit | Attending: Obstetrics and Gynecology | Admitting: Obstetrics and Gynecology

## 2017-10-24 ENCOUNTER — Encounter: Payer: Self-pay | Admitting: Obstetrics and Gynecology

## 2017-10-24 ENCOUNTER — Ambulatory Visit (INDEPENDENT_AMBULATORY_CARE_PROVIDER_SITE_OTHER): Admitting: Obstetrics and Gynecology

## 2017-10-24 VITALS — BP 104/56 | HR 92 | Ht 62.0 in | Wt 226.7 lb

## 2017-10-24 DIAGNOSIS — R87810 Cervical high risk human papillomavirus (HPV) DNA test positive: Secondary | ICD-10-CM

## 2017-10-24 DIAGNOSIS — R87610 Atypical squamous cells of undetermined significance on cytologic smear of cervix (ASC-US): Secondary | ICD-10-CM | POA: Insufficient documentation

## 2017-10-24 DIAGNOSIS — Z3202 Encounter for pregnancy test, result negative: Secondary | ICD-10-CM

## 2017-10-24 LAB — POCT PREGNANCY, URINE: Preg Test, Ur: NEGATIVE

## 2017-10-24 NOTE — Progress Notes (Signed)
Colposcopy Procedure Note  Pre-operative Diagnosis: pap positive for ASCUS, high risk HPV  Impression: low grade  Indications:  ASCUS, positive high risk HPV  Procedure Details  LMP s/p c-section 8 weeks ago; UPT negative.    The risks (including infection, bleeding, pain) and benefits of the procedure were explained to the patient and written informed consent was obtained.  The patient was placed in the dorsal lithotomy position. A bivalve was speculum inserted in the vagina, and the cervix was visualized.  Acetic acid swabbed onto cervix with two minute areas of acetowhite noted.  Cervix noted to be almost closed and cervical opening was enlarged slightly with qtip however squamocolumnar junction unable to be fully visualized. Biopsies taken at 5 o'clock and 10 o'clock.  ECC in all four quadrants done. Small amount of bleeding with biopsies that stopped with pressure and Monsel's solution.   Findings: minute acetowhite changes   Adequate: no  Specimens: 5 o'clock, 10 o'clock, ECC  Condition: Stable  Complications: None  Plan: The patient was advised to call for any fever or for prolonged or severe pain or bleeding. She was advised to use OTC analgesics as needed for mild to moderate pain. She was advised to avoid vaginal intercourse for 48 hours or until the bleeding has completely stopped.   Feliz Beam, M.D. Attending [place], [facility] for [employer], [place]

## 2017-10-31 ENCOUNTER — Telehealth: Payer: Self-pay | Admitting: *Deleted

## 2017-10-31 NOTE — Telephone Encounter (Signed)
Patient left message on nurse voicemail on 10/27/17 at 1402.  States she had colpo and now it feels like something like skin is coming out of her vagina.  Requests a return call.

## 2017-11-07 ENCOUNTER — Encounter: Payer: Self-pay | Admitting: General Practice

## 2017-11-24 ENCOUNTER — Encounter (HOSPITAL_COMMUNITY): Payer: Self-pay

## 2018-02-01 IMAGING — US US MFM FETAL NUCHAL TRANSLUCENCY
1 series · 14 of 28 positions shown · non-contrast
Comparison: none

[Series 1: us mfm fetal nuchal translucency · 14 of 38 slices shown]
[im 2/38]
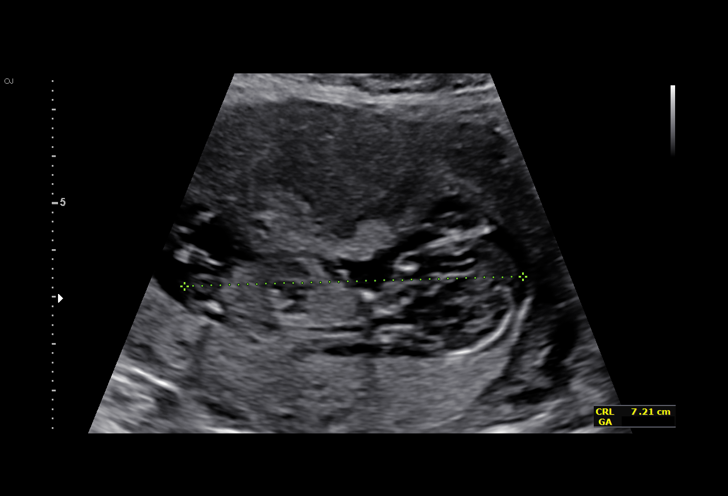
[im 5/38]
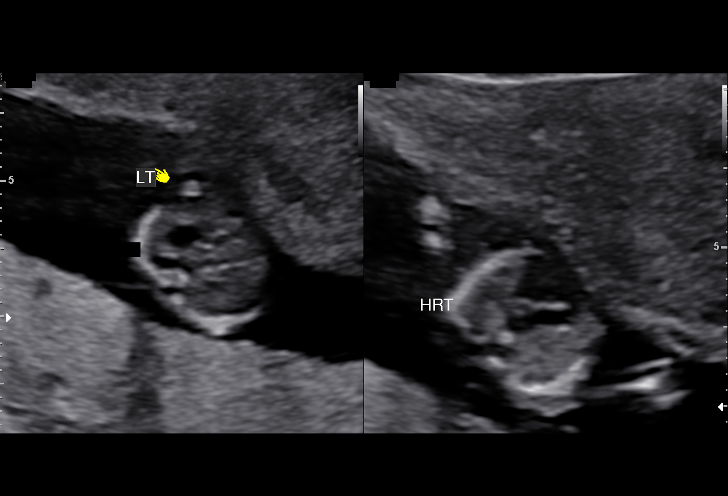
[im 7/38]
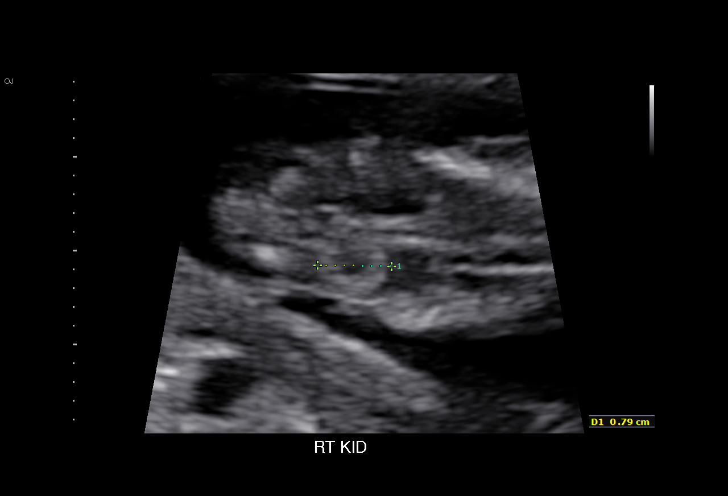
[im 10/38]
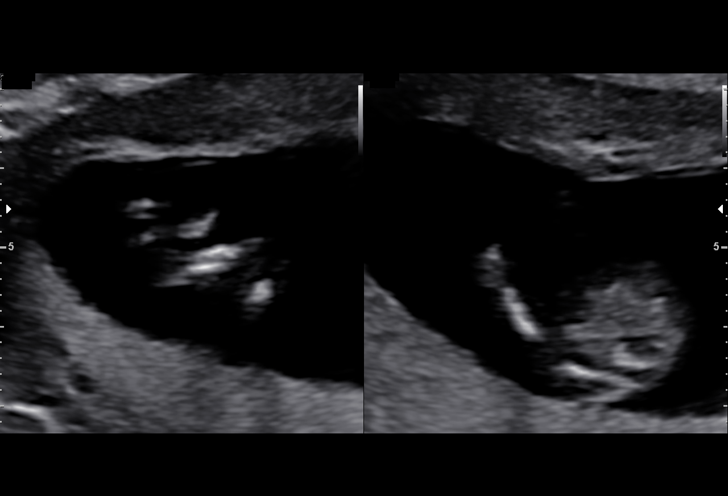
[im 13/38]
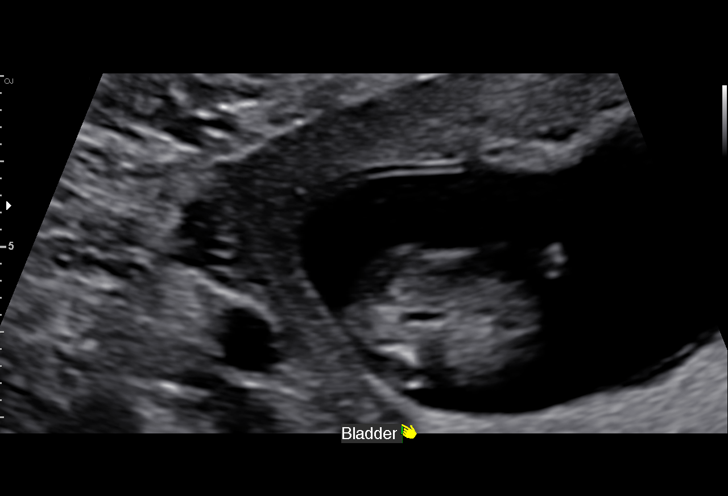
[im 16/38]
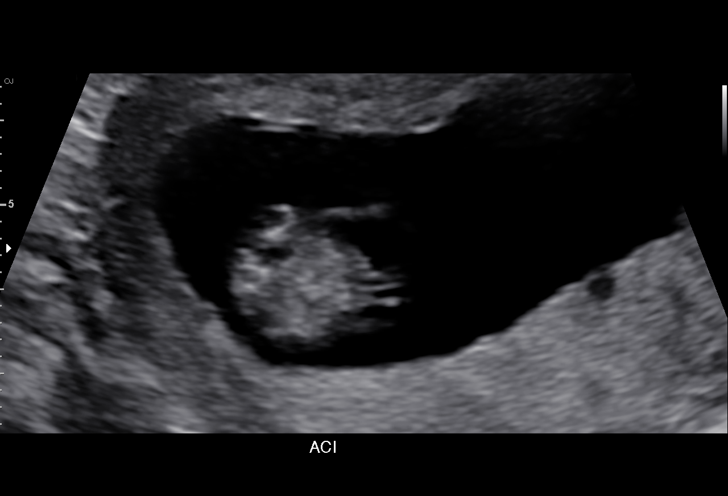
[im 18/38]
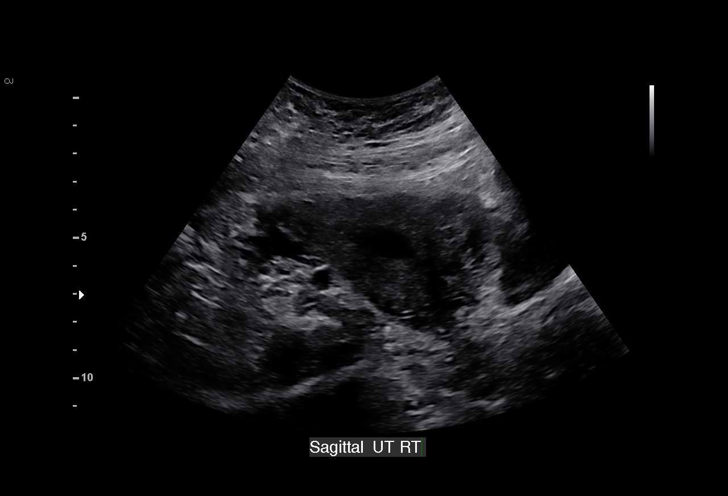
[im 21/38]
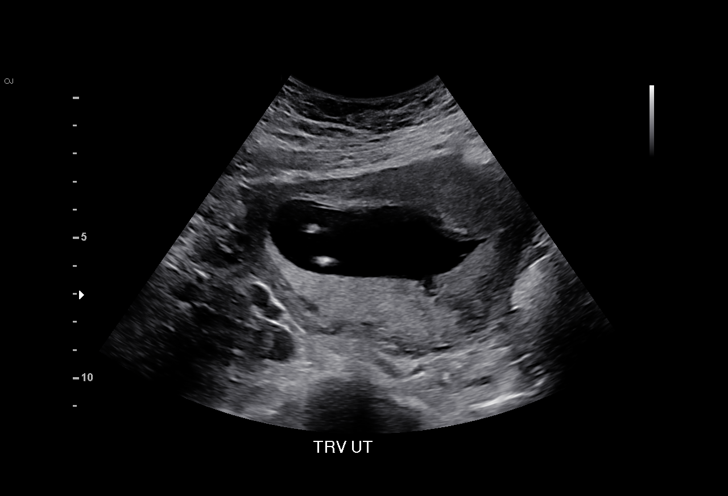
[im 24/38]
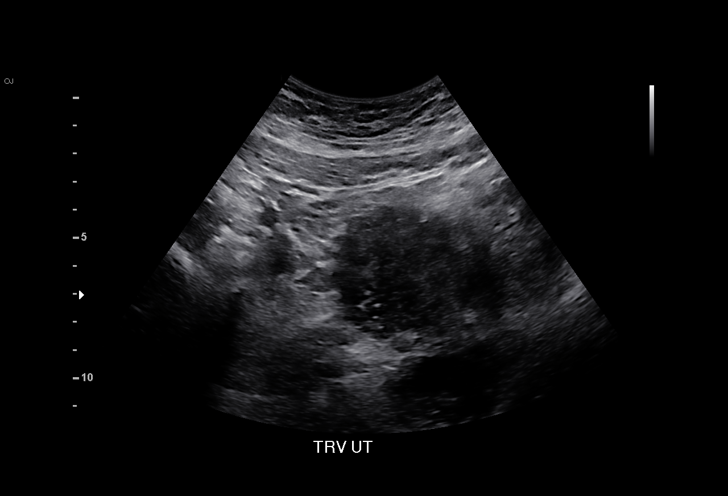
[im 27/38]
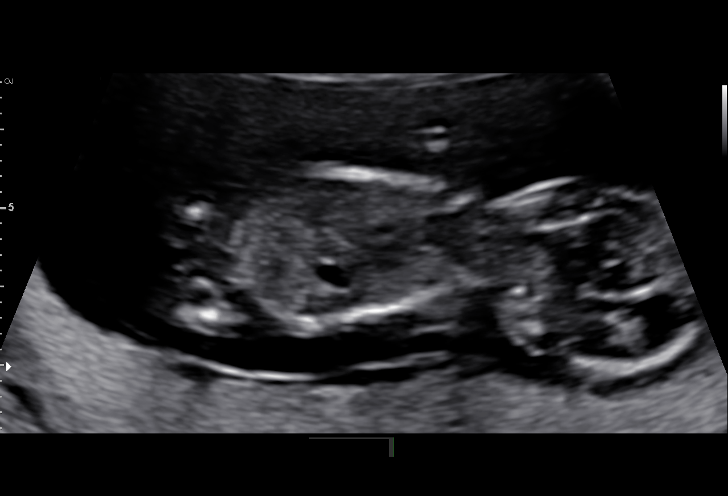
[im 29/38]
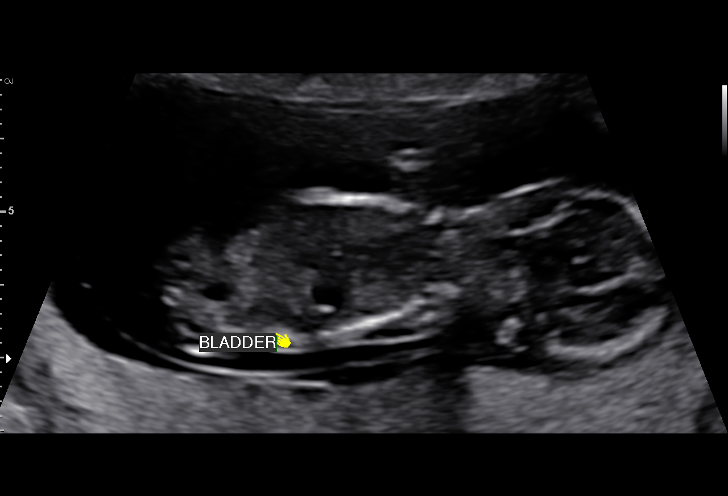
[im 32/38]
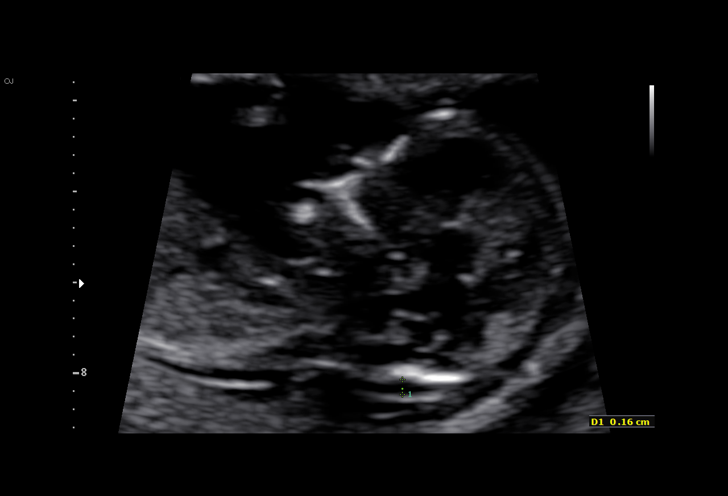
[im 35/38]
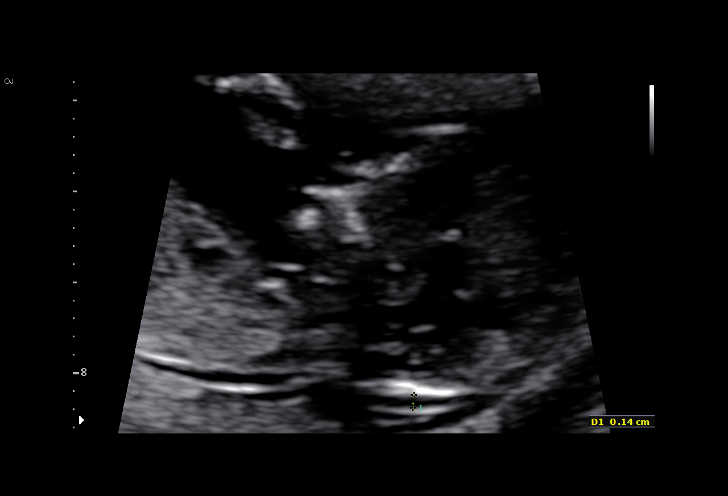
[im 38/38]
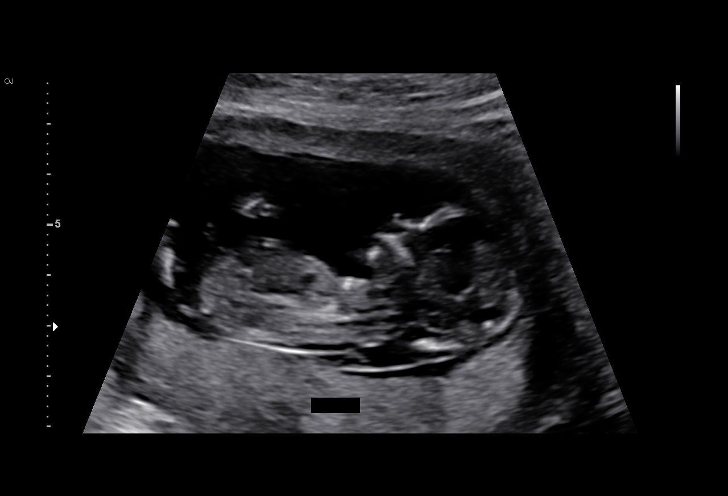

[14 of 28 positions shown; findings below may reference images not displayed]

TRANSLUCENCY

1  JONCKHEERE STURTEWAGEN              [id number]      [id number]     [id number]
Indications

13 weeks gestation of pregnancy
Encounter for nuchal translucency
Hypothyroid -synthroid
Advanced maternal age multigravida (35),
first trimester
History of cesarean delivery, currently
pregnant
OB History

Blood Type:            Height:  5'2"   Weight (lb):  185      BMI:
Gravidity:    3         Term:   1        Prem:   0        SAB:   1
TOP:          0       Ectopic:  0        Living: 1
Fetal Evaluation

Num Of Fetuses:     1
Preg. Location:     Intrauterine
Gest. Sac:          Intrauterine
Fetal Pole:         Visualized
Fetal Heart         164
Rate(bpm):
Cardiac Activity:   Observed
Biometry

CRL:      71.4  mm     G. Age:  13w 1d                  EDD:   08/31/17
Gestational Age
LMP:           13w 0d       Date:   11/25/16                 EDD:   09/01/17
Best:          13w 0d    Det. By:   LMP  (11/25/16)          EDD:   09/01/17
1st Trimester Genetic Sonogram Screening

CRL:            71.4  mm    G. Age:   13w 1d                 EDD:   08/31/17
Nuc Trans:         2  mm

Nasal Bone:                 Present
Anatomy

Cranium:               Appears normal         Kidneys:                Visualized
Choroid Plexus:        Appears normal         Bladder:                Appears normal
Stomach:               Appears normal, left   Upper Extremities:      Visualized
sided
Abdominal Wall:        Appears nml (cord      Lower Extremities:      Visualized
insert, abd wall)
Cervix Uterus Adnexa

Uterus
No abnormality visualized.

Left Ovary
Not visualized. No adnexal mass visualized.

Right Ovary
Not visualized. No adnexal mass visualized.
Impression

Single IUP at 13w 0d
Advanced maternal age
Normal NT (2.0 mm).  Nasal bone visualized.
First trimester aneuploidy screen performed as noted above.
Recommendations

See separate consult not from Genetics counseling.
Recommend ultrasound for fetal anatomy at 18-20 weeks
Patient should be offered MSAFP for neural tube defect
screening.

## 2018-03-16 IMAGING — US US MFM OB DETAIL+14 WK
1 series · 14 of 28 positions shown · non-contrast
Comparison: none

[Series 1: us mfm ob detail+14 wk · 65 acquisitions, 14 frames shown]
[im 3/65]
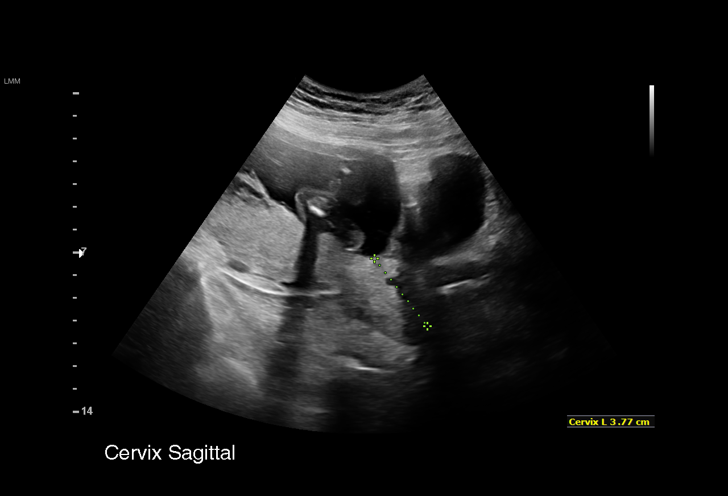
[im 8/65]
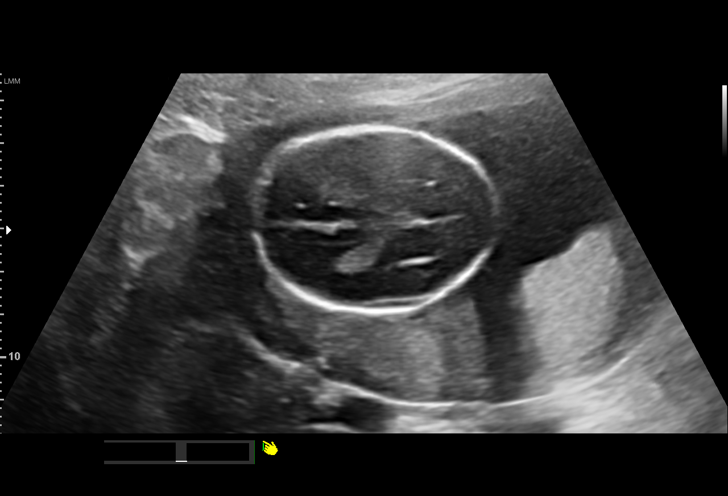
[im 12/65]
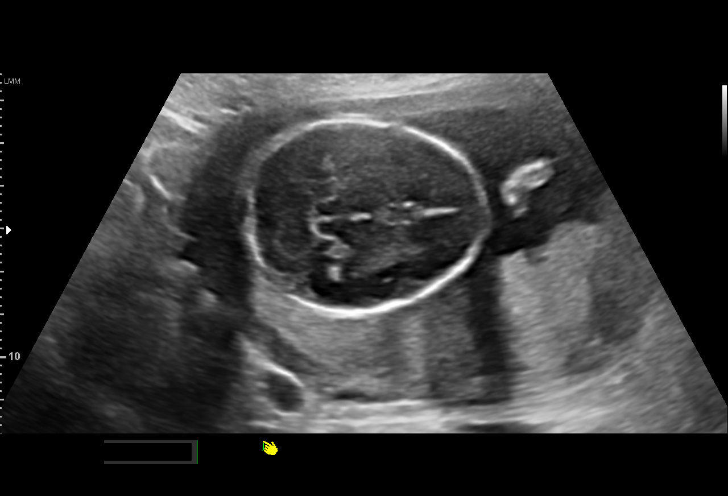
[im 17/65]
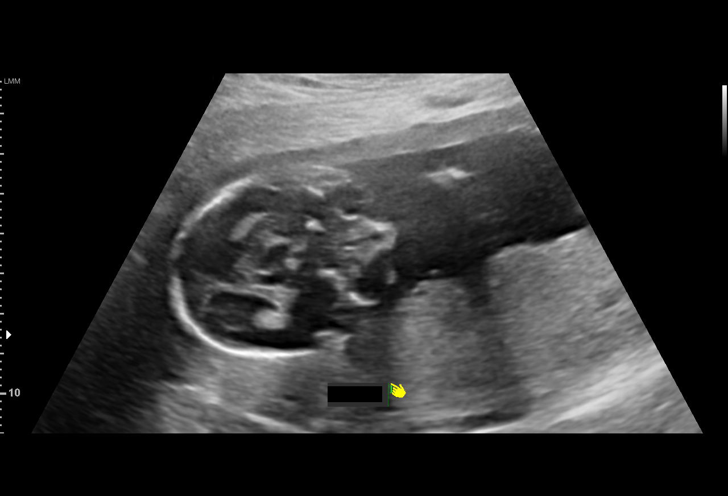
[im 22/65]
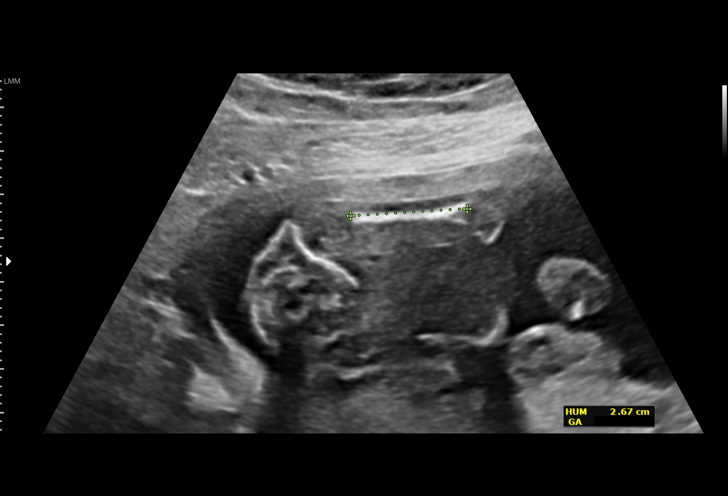
[im 27/65]
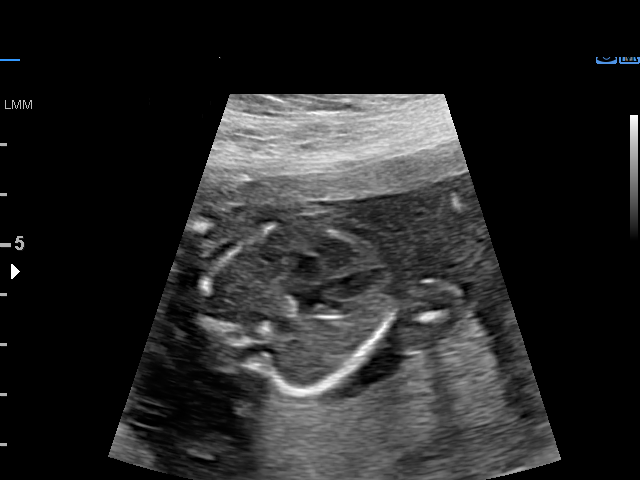
[im 31/65]
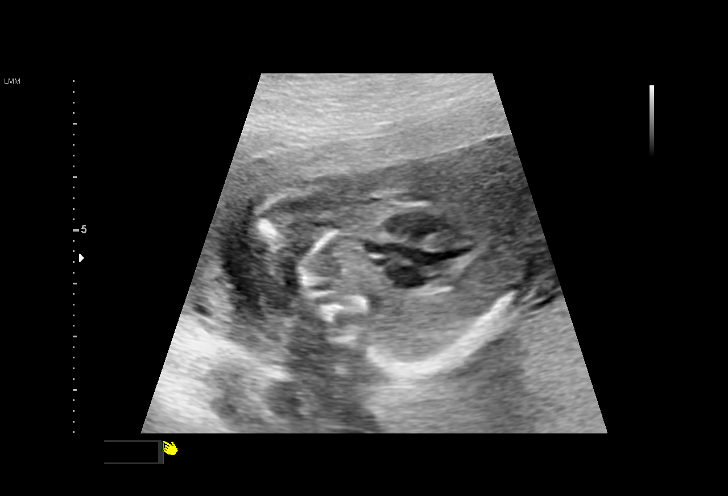
[im 36/65]
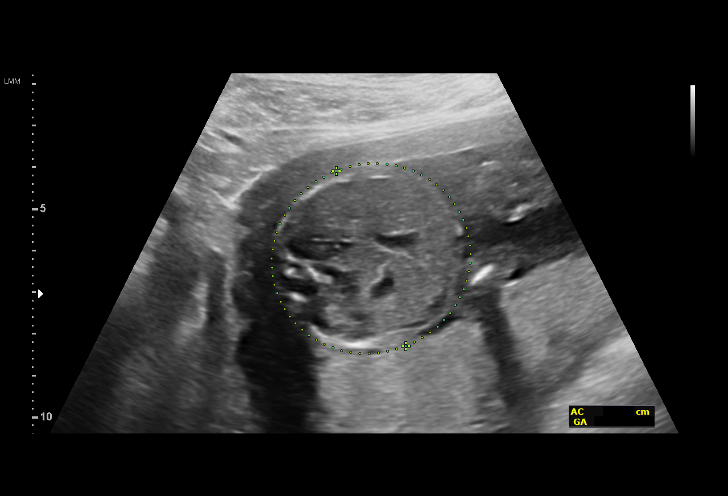
[im 41/65]
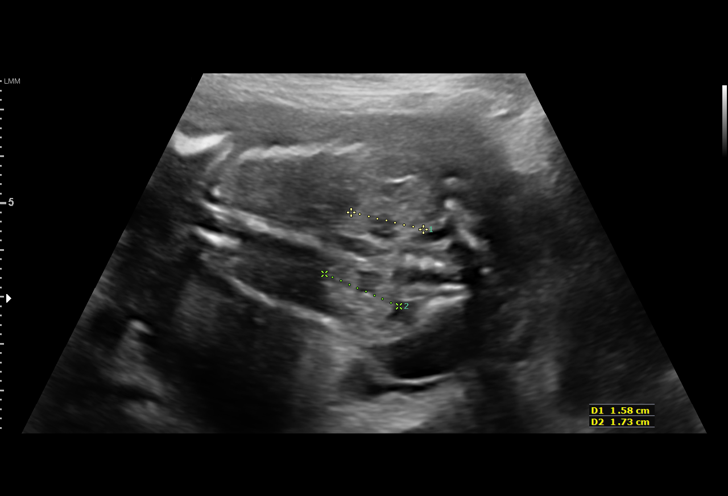
[im 46/65]
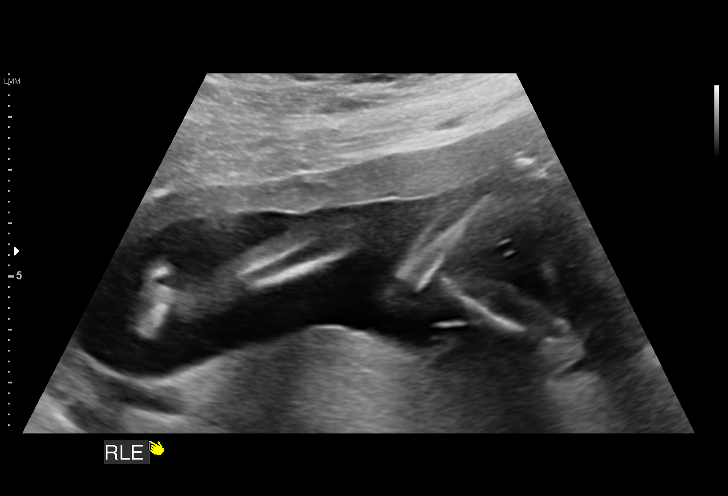
[im 50/65]
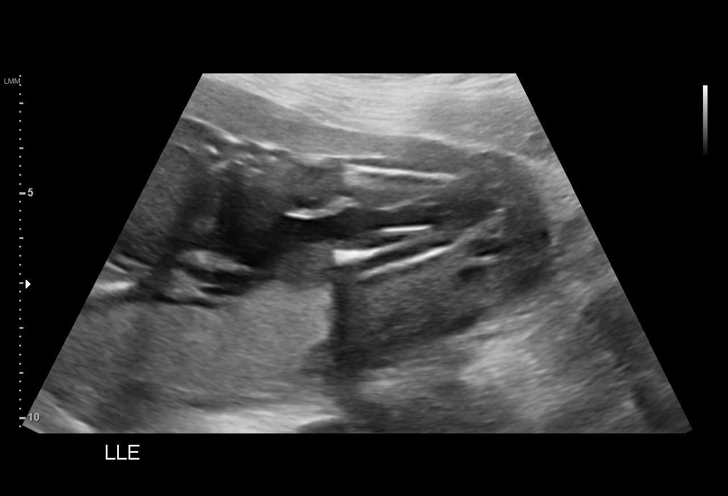
[im 55/65]
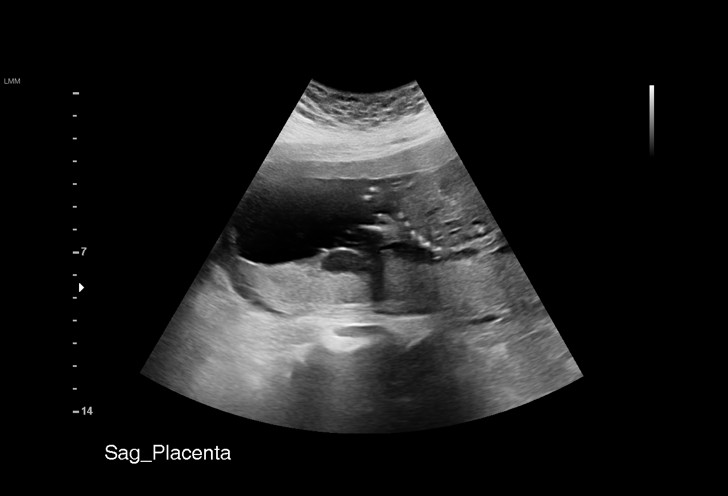
[im 60/65]
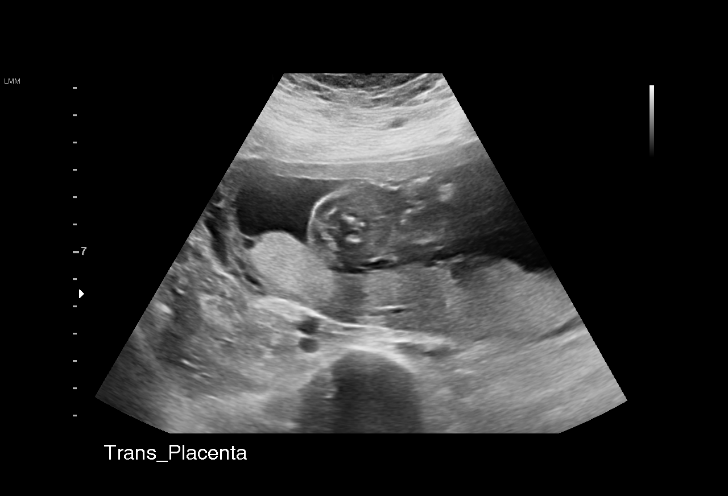
[im 65/65]
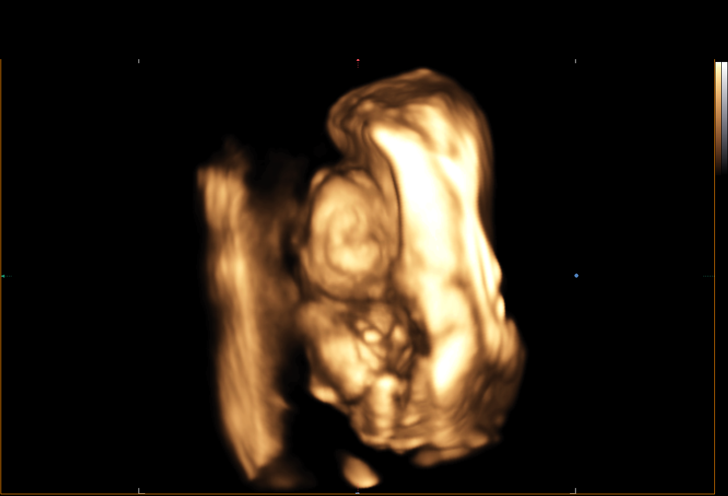

[14 of 28 positions shown; findings below may reference images not displayed]

1  ABIOLA BOUZA            [id number]      [id number]     [id number]
Indications

19 weeks gestation of pregnancy
Encounter for fetal anatomic survey
Hypothyroid -synthroid
Advanced maternal age multigravida (35),
first trimester (low risk Panorama)
History of cesarean delivery, currently
pregnant
OB History

Blood Type:            Height:  5'2"   Weight (lb):  185      BMI:
Gravidity:    3         Term:   1        Prem:   0        SAB:   1
TOP:          0       Ectopic:  0        Living: 1
Fetal Evaluation

Num Of Fetuses:     1
Fetal Heart         161
Rate(bpm):
Cardiac Activity:   Observed
Presentation:       Breech
Placenta:           Posterior, low-lying, .7 cm from int os
P. Cord Insertion:  Not well visualized

Amniotic Fluid
AFI FV:      Subjectively within normal limits

Largest Pocket(cm)
5.65
Biometry
BPD:      43.2  mm     G. Age:  19w 0d         47  %    CI:        70.96   %   70 - 86
FL/HC:      16.7   %   16.1 -
HC:      163.4  mm     G. Age:  19w 1d         41  %    HC/AC:      1.13       1.09 -
AC:      144.3  mm     G. Age:  19w 5d         66  %    FL/BPD:     63.2   %
FL:       27.3  mm     G. Age:  18w 2d         18  %    FL/AC:      18.9   %   20 - 24
HUM:      26.7  mm     G. Age:  18w 3d         32  %
CER:      19.8  mm     G. Age:  18w 6d         45  %
NFT:       2.5  mm

CM:        4.7  mm
Est. FW:     274  gm    0 lb 10 oz      44  %
Gestational Age

LMP:           19w 1d       Date:   11/25/16                 EDD:   09/01/17
U/S Today:     19w 0d                                        EDD:   09/02/17
Best:          19w 1d    Det. By:   LMP  (11/25/16)          EDD:   09/01/17
Anatomy

Cranium:               Appears normal         Aortic Arch:            Appears normal
Cavum:                 Appears normal         Ductal Arch:            Appears normal
Ventricles:            Appears normal         Diaphragm:              Appears normal
Choroid Plexus:        Appears normal         Stomach:                Appears normal, left
sided
Cerebellum:            Appears normal         Abdomen:                Appears normal
Posterior Fossa:       Appears normal         Abdominal Wall:         Not well visualized
Nuchal Fold:           Appears normal         Cord Vessels:           Not well visualized
Face:                  Orbits appear          Kidneys:                Appear normal
normal
Lips:                  Appears normal         Bladder:                Appears normal
Thoracic:              Appears normal         Spine:                  Not well visualized
Heart:                 Appears normal         Upper Extremities:      Appears normal
(4CH, axis, and situs
RVOT:                  Appears normal         Lower Extremities:      Appears normal
LVOT:                  Appears normal

Other:  Fetus appears to be a male. Heels and fifth digit also not visualized.
Technically difficult due to maternal habitus and fetal position.
Cervix Uterus Adnexa

Cervix
Length:           3.77  cm.
Normal appearance by transabdominal scan.

Uterus
No abnormality visualized.

Left Ovary
Not visualized.

Right Ovary
Not visualized.

Cul De Sac:   No free fluid seen.

Adnexa:       No abnormality visualized.
Impression

Singleton intrauterine pregnancy at 19+1 weeks, here for
anatomic survey. History of hypothyroidism
Review of the anatomy shows no sonographic markers for
aneuploidy or structural anomalies
However, evaluation should be considered suboptimal
secondary to maternal body habitus and fetal position
Amniotic fluid volume is normal
Estimated fetal weight is 274g which is growth in the 44th
percentile
Recommendations

Repeat scan in 4 weeks to complete anatomic survey

## 2018-04-03 ENCOUNTER — Encounter: Payer: Self-pay | Admitting: *Deleted

## 2018-04-03 ENCOUNTER — Telehealth: Payer: Self-pay | Admitting: *Deleted

## 2018-04-03 MED ORDER — LEVOTHYROXINE SODIUM 175 MCG PO TABS: 175.0000 ug | ORAL_TABLET | Freq: Every day | ORAL | 2 refills | Status: AC

## 2018-04-03 NOTE — Telephone Encounter (Signed)
Called pt and got voice mail. I left a message stating that I am calling regarding a medication refill and will send her a message via MyChart.

## 2018-04-25 ENCOUNTER — Encounter: Payer: Self-pay | Admitting: *Deleted

## 2018-08-28 IMAGING — US US MFM OB FOLLOW-UP
1 series · 13 of 28 positions shown · non-contrast
Comparison: none

[Series 1: us mfm ob follow-up · 13 of 86 slices shown]
[im 4/86]
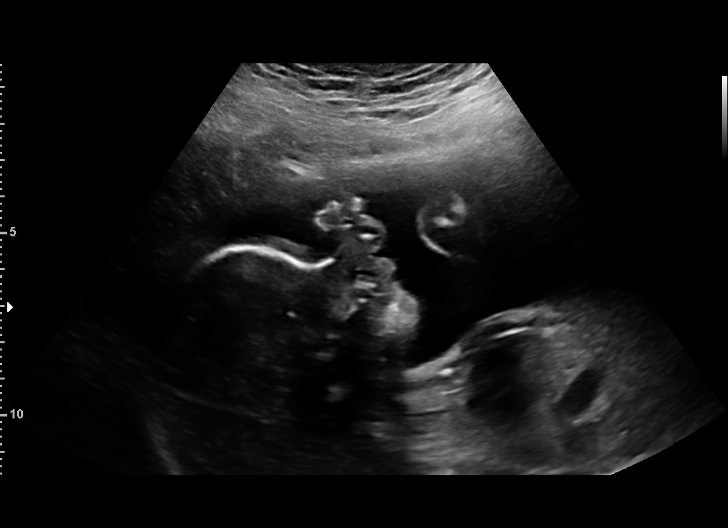
[im 10/86]
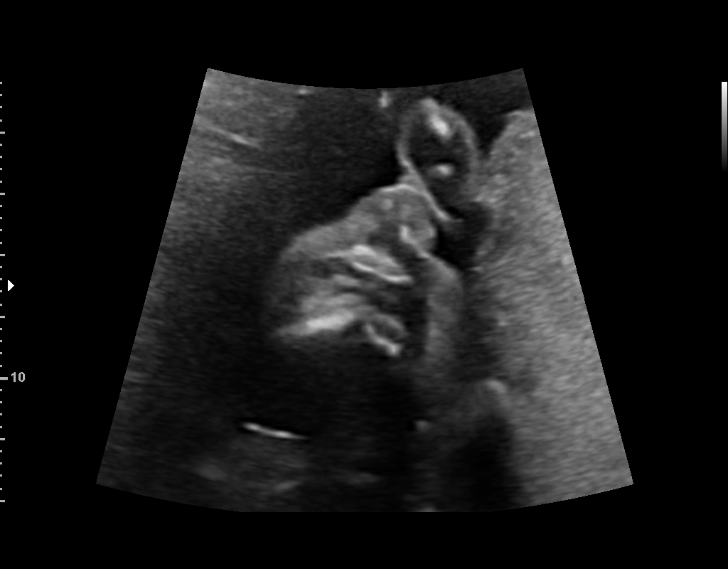
[im 16/86]
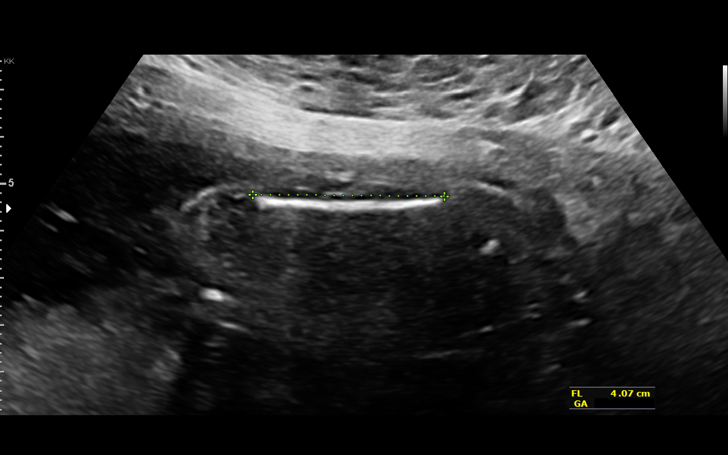
[im 23/86]
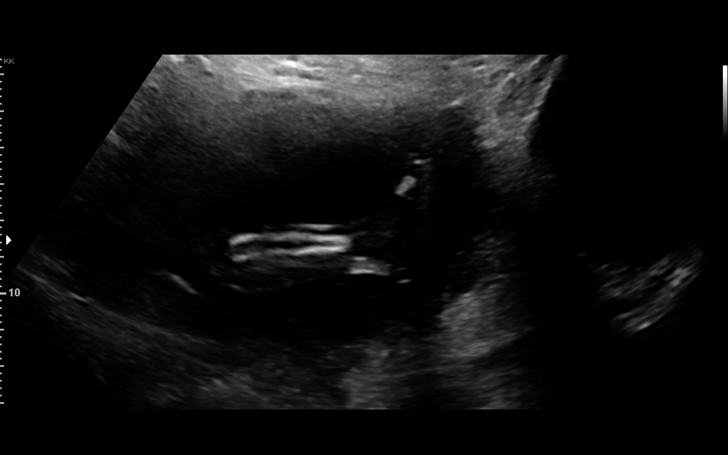
[im 29/86]
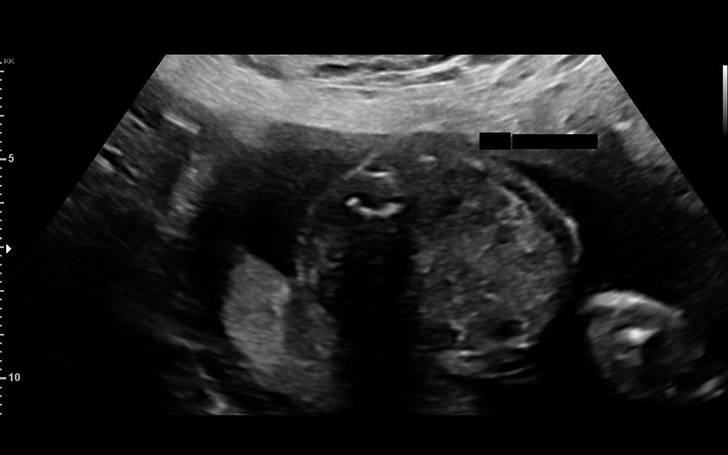
[im 35/86]
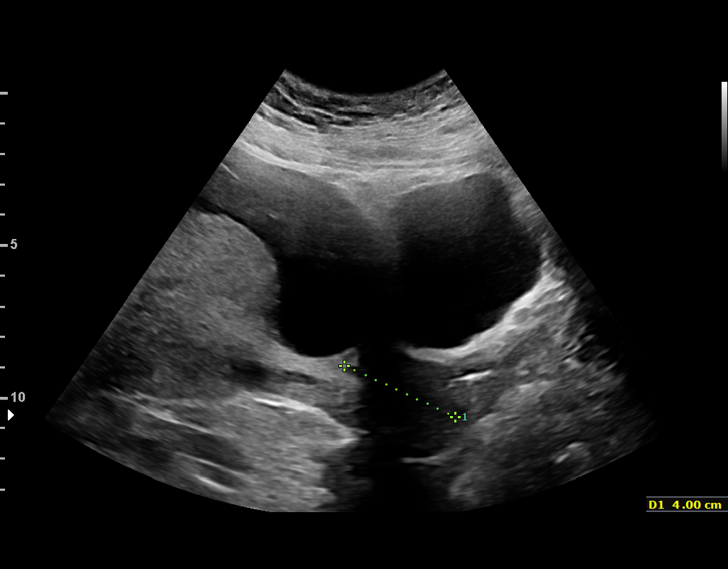
[im 45/86]
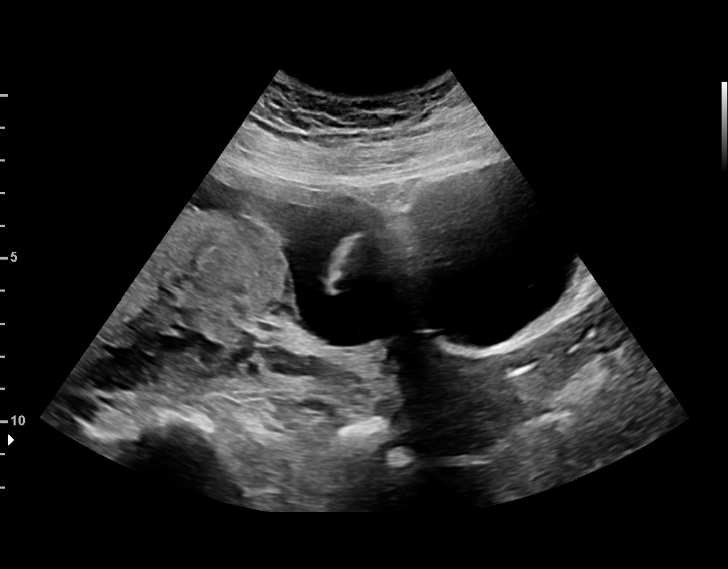
[im 51/86]
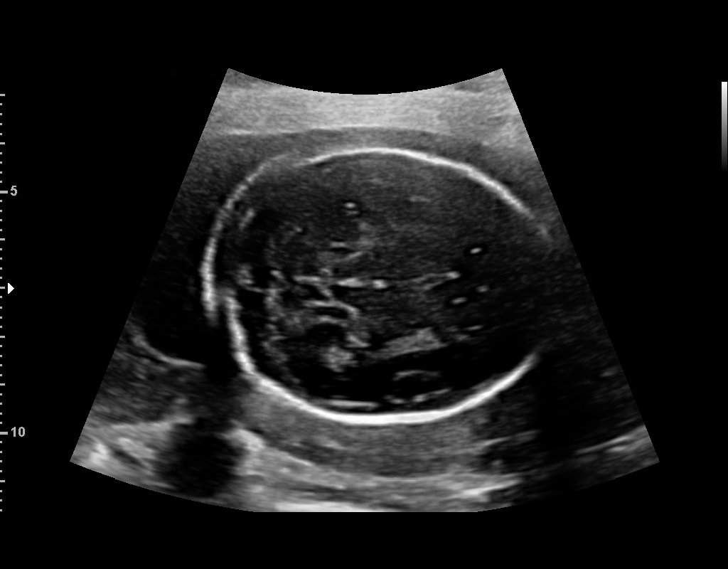
[im 57/86]
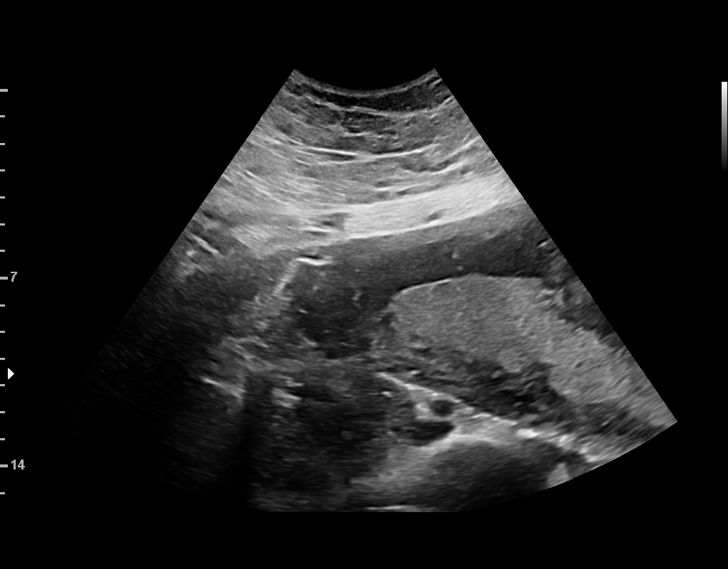
[im 63/86]
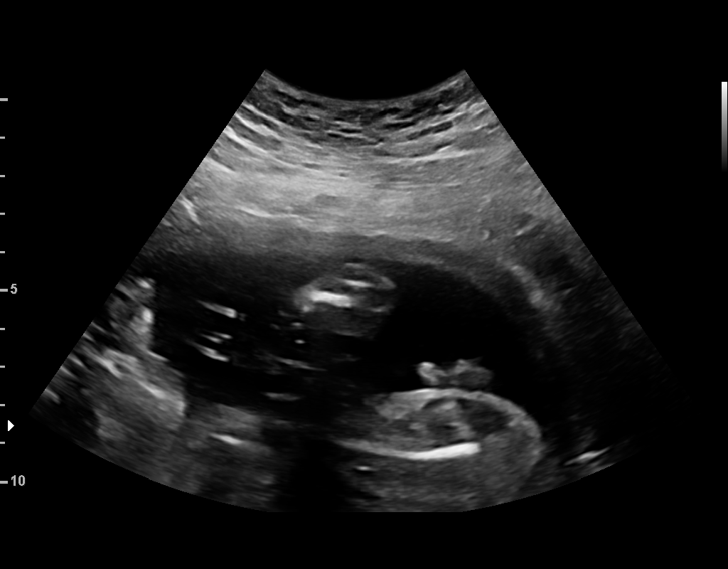
[im 70/86]
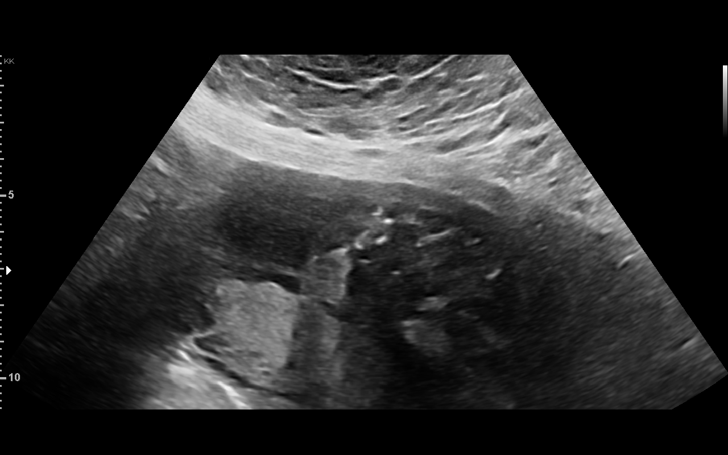
[im 76/86]
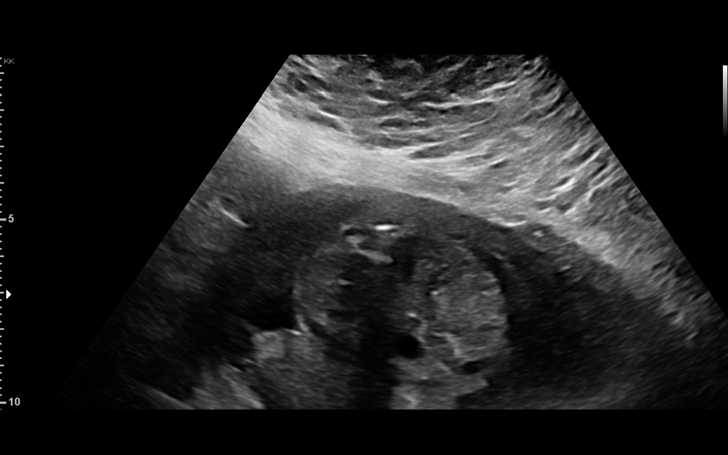
[im 82/86]
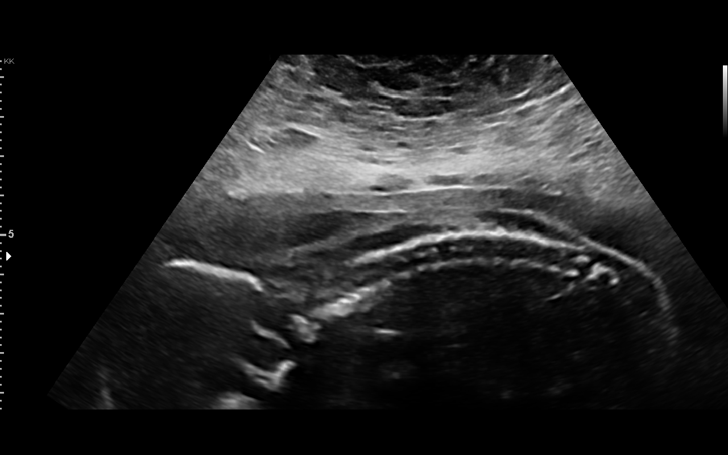

[13 of 28 positions shown; findings below may reference images not displayed]

1  SASAGAGA TIRINDA              [id number]      [id number]     [id number]
Indications

23 weeks gestation of pregnancy
Hypothyroid -synthroid
Advanced maternal age multigravida (35),
first trimester (low risk Panorama)
History of cesarean delivery, currently
pregnant
Encounter for other antenatal screening
follow-up
Obesity complicating pregnancy, second
trimester
OB History

Blood Type:            Height:  5'2"   Weight (lb):  185      BMI:
Gravidity:    3         Term:   1        Prem:   0        SAB:   1
TOP:          0       Ectopic:  0        Living: 1
Fetal Evaluation

Num Of Fetuses:     1
Fetal Heart         151
Rate(bpm):
Cardiac Activity:   Observed
Presentation:       Transverse, head to maternal right
Placenta:           Posterior, above cervical os
P. Cord Insertion:  Not well visualized

Amniotic Fluid
AFI FV:      Subjectively within normal limits

Largest Pocket(cm)
4.0
Biometry

BPD:        56  mm     G. Age:  23w 1d         43  %    CI:        72.73   %   70 - 86
FL/HC:      20.2   %   19.2 -
HC:      208.8  mm     G. Age:  23w 0d         28  %    HC/AC:      1.07       1.05 -
AC:      195.6  mm     G. Age:  24w 2d         76  %    FL/BPD:     75.4   %   71 - 87
FL:       42.2  mm     G. Age:  23w 5d         59  %    FL/AC:      21.6   %   20 - 24

Est. FW:     638  gm      1 lb 7 oz     65  %
Gestational Age

LMP:           23w 1d       Date:   11/25/16                 EDD:   09/01/17
U/S Today:     23w 4d                                        EDD:   08/29/17
Best:          23w 1d    Det. By:   LMP  (11/25/16)          EDD:   09/01/17
Anatomy

Cranium:               Appears normal         Aortic Arch:            Previously seen
Cavum:                 Appears normal         Ductal Arch:            Previously seen
Ventricles:            Appears normal         Diaphragm:              Previously seen
Choroid Plexus:        Previously seen        Stomach:                Appears normal, left
sided
Cerebellum:            Appears normal         Abdomen:                Appears normal
Posterior Fossa:       Previously seen        Abdominal Wall:         Appears nml (cord
insert, abd wall)
Nuchal Fold:           Previously seen        Cord Vessels:           Appears normal (3
vessel cord)
Face:                  Appears normal         Kidneys:                Appear normal
(orbits and profile)
Lips:                  Appears normal         Bladder:                Appears normal
Thoracic:              Appears normal         Spine:                  Appears normal
Heart:                 Previously seen        Upper Extremities:      Previously seen
RVOT:                  Previously seen        Lower Extremities:      Previously seen
LVOT:                  Previously seen

Other:  Male gender. Heels and 5th digit visualized.  Technically difficult due
to maternal habitus and fetal position.
Cervix Uterus Adnexa

Cervix
Length:              4  cm.
Normal appearance by transabdominal scan.

Adnexa:       No abnormality visualized.
Impression

Singleton intrauterine pregnancy at 23 weeks 1 day gestation
with fetal cardiac activity
Transverse presentation
Posterior placenta without evidence of previa
Normal appearing fetal growth and amniotic fluid volume
Completion of fetal anatomic survey
Normal appearing cervical length
Recommendations

Follow-up ultrasounds as clinically indicated.

## 2022-06-28 ENCOUNTER — Ambulatory Visit (INDEPENDENT_AMBULATORY_CARE_PROVIDER_SITE_OTHER): Admitting: Obstetrics & Gynecology

## 2022-06-28 ENCOUNTER — Encounter (HOSPITAL_BASED_OUTPATIENT_CLINIC_OR_DEPARTMENT_OTHER): Payer: Self-pay | Admitting: Obstetrics & Gynecology

## 2022-06-28 VITALS — BP 119/83 | HR 113 | Ht 62.0 in | Wt 216.0 lb

## 2022-06-28 DIAGNOSIS — N920 Excessive and frequent menstruation with regular cycle: Secondary | ICD-10-CM | POA: Diagnosis not present

## 2022-06-28 NOTE — Progress Notes (Unsigned)
GYNECOLOGY  VISIT  CC:   Irregular cycles  HPI: 41 y.o. G23P1011 Married White or Caucasian female here for new patient appointment.  Reports a lot of changes that seemed to occur after her son was born 38 years ago.  Reports her flow is very heavy for a day or two that is associated with very significant pain.  Pain is so severe that she cannot get out of bed.  Flow is very heavy on day two of cycle.  Is so heavy she can't go any where or do any thing.  Nothing seems to help pain as well.  Endometrial ablation was discussed for treatment of bleeding.  Saw provider at [facility].  Has done ultrasounds.  Right ovary was not seen.  There was some concern that it was non-functioning.  They have moved around a lot as her husband was in the [place].  He is now in the [employer].     Past Medical History:  Diagnosis Date   Hypothyroidism    Kidney stone    LGA (large for gestational age) fetus affecting management of mother 08/09/2017   At 36 weeks (08/09/2017): Estimated fetal weight is 3235g which is growth in the 83rd  percentile. AC is in the 95th percentile '[ ]'$  repeat scan in 4 weeks   Mitral valve regurgitation    trace amount by echo in 2013   Normal cardiac stress test    EF 65%, no ischemia 05/09/2012 at [place]   [place]    Thyroid disease    Ventricular hypertrophy left   mild LVH seen on previous echo on 04/18/2012 at [place] in [place]    MEDS:   Current Outpatient Medications on File Prior to Visit  Medication Sig Dispense Refill   ALPRAZolam (XANAX) 0.5 MG tablet Take 0.5 mg at bedtime as needed by mouth for anxiety.     amphetamine-dextroamphetamine (ADDERALL) 30 MG tablet Take 30 mg daily by mouth.     hydrOXYzine (VISTARIL) 25 MG capsule Take 25 mg by mouth daily.     levothyroxine (SYNTHROID) 175 MCG tablet Take 1 tablet (175 mcg total) by mouth daily before breakfast. 30 tablet 2   Semaglutide,0.25 or 0.'5MG'$ /DOS, (OZEMPIC, 0.25 OR 0.5 MG/DOSE,) 2  MG/1.5ML SOPN Inject 2 mg into the skin once a week.     Vitamin D, Ergocalciferol, (DRISDOL) 50000 units CAPS capsule Take 50,000 Units by mouth every 7 (seven) days.     Current Facility-Administered Medications on File Prior to Visit  Medication Dose Route Frequency Provider Last Rate Last Admin   Vitamin D (Ergocalciferol) (DRISDOL) capsule 50,000 Units  50,000 Units Oral Q7 days Tresea Mall, CNM        ALLERGIES: Haloperidol  SH:  married, non smoker  Review of Systems  Constitutional: Negative.   Genitourinary:        Heavy menstrual bleeding    PHYSICAL EXAMINATION:    BP 119/83 (BP Location: Right Arm, Patient Position: Sitting, Cuff Size: Large)   Pulse (!) 113   Ht '5\' 2"'$  (1.575 m) Comment: Reported  Wt 216 lb (98 kg)   LMP 06/10/2022 (Approximate)   BMI 39.51 kg/m     General appearance: alert, cooperative and appears stated age No physical exam performed  Assessment/Plan: 1. Menorrhagia with regular cycle - pt has already undergone some evaluation with ultrasounds and pap smear.  This was done with the [employer].  States she can obtain records.  She will let me know  if can get records.  If not, will just plan to restart evaluation.   - will obtain some initial blood work today. - Estradiol - FSH/LH

## 2022-06-29 ENCOUNTER — Encounter (HOSPITAL_BASED_OUTPATIENT_CLINIC_OR_DEPARTMENT_OTHER): Payer: Self-pay | Admitting: Obstetrics & Gynecology

## 2022-06-29 LAB — ESTRADIOL: Estradiol: 207 pg/mL

## 2022-06-29 LAB — FSH/LH
FSH: 2.9 m[IU]/mL
LH: 5.4 m[IU]/mL

## 2022-06-30 ENCOUNTER — Other Ambulatory Visit (HOSPITAL_BASED_OUTPATIENT_CLINIC_OR_DEPARTMENT_OTHER): Payer: Self-pay | Admitting: Obstetrics & Gynecology

## 2022-06-30 DIAGNOSIS — N920 Excessive and frequent menstruation with regular cycle: Secondary | ICD-10-CM

## 2022-07-01 DIAGNOSIS — N920 Excessive and frequent menstruation with regular cycle: Secondary | ICD-10-CM | POA: Insufficient documentation

## 2022-08-11 ENCOUNTER — Emergency Department (HOSPITAL_BASED_OUTPATIENT_CLINIC_OR_DEPARTMENT_OTHER)
Admission: EM | Admit: 2022-08-11 | Discharge: 2022-08-11 | Disposition: A | Discharge status: Home / Self Care | Attending: Emergency Medicine | Admitting: Emergency Medicine

## 2022-08-11 ENCOUNTER — Encounter (HOSPITAL_BASED_OUTPATIENT_CLINIC_OR_DEPARTMENT_OTHER): Payer: Self-pay

## 2022-08-11 ENCOUNTER — Emergency Department (HOSPITAL_BASED_OUTPATIENT_CLINIC_OR_DEPARTMENT_OTHER)

## 2022-08-11 ENCOUNTER — Other Ambulatory Visit: Payer: Self-pay

## 2022-08-11 DIAGNOSIS — E876 Hypokalemia: Secondary | ICD-10-CM | POA: Insufficient documentation

## 2022-08-11 DIAGNOSIS — K1120 Sialoadenitis, unspecified: Secondary | ICD-10-CM | POA: Diagnosis not present

## 2022-08-11 DIAGNOSIS — R519 Headache, unspecified: Secondary | ICD-10-CM | POA: Diagnosis present

## 2022-08-11 DIAGNOSIS — Z794 Long term (current) use of insulin: Secondary | ICD-10-CM | POA: Insufficient documentation

## 2022-08-11 DIAGNOSIS — R000 Tachycardia, unspecified: Secondary | ICD-10-CM | POA: Insufficient documentation

## 2022-08-11 LAB — CBC WITH DIFFERENTIAL/PLATELET
Abs Immature Granulocytes: 0.01 10*3/uL (ref 0.00–0.07)
Basophils Absolute: 0.1 10*3/uL (ref 0.0–0.1)
Basophils Relative: 1 %
Eosinophils Absolute: 0.1 10*3/uL (ref 0.0–0.5)
Eosinophils Relative: 1 %
HCT: 41.5 % (ref 36.0–46.0)
Hemoglobin: 13.9 g/dL (ref 12.0–15.0)
Immature Granulocytes: 0 %
Lymphocytes Relative: 29 %
Lymphs Abs: 2.2 10*3/uL (ref 0.7–4.0)
MCH: 28.9 pg (ref 26.0–34.0)
MCHC: 33.5 g/dL (ref 30.0–36.0)
MCV: 86.3 fL (ref 80.0–100.0)
Monocytes Absolute: 0.5 10*3/uL (ref 0.1–1.0)
Monocytes Relative: 6 %
Neutro Abs: 4.9 10*3/uL (ref 1.7–7.7)
Neutrophils Relative %: 63 %
Platelets: 369 10*3/uL (ref 150–400)
RBC: 4.81 MIL/uL (ref 3.87–5.11)
RDW: 12.6 % (ref 11.5–15.5)
WBC: 7.7 10*3/uL (ref 4.0–10.5)
nRBC: 0 % (ref 0.0–0.2)

## 2022-08-11 LAB — BASIC METABOLIC PANEL
Anion gap: 14 (ref 5–15)
BUN: 10 mg/dL (ref 6–20)
CO2: 23 mmol/L (ref 22–32)
Calcium: 9.4 mg/dL (ref 8.9–10.3)
Chloride: 104 mmol/L (ref 98–111)
Creatinine, Ser: 0.6 mg/dL (ref 0.44–1.00)
GFR, Estimated: >60 mL/min (ref >60)
Glucose, Bld: 73 mg/dL (ref 70–99)
Potassium: 3.3 mmol/L — ABNORMAL LOW (ref 3.5–5.1)
Sodium: 141 mmol/L (ref 135–145)

## 2022-08-11 LAB — HCG, QUANTITATIVE, PREGNANCY: hCG, Beta Chain, Quant, S: 1 m[IU]/mL (ref <5)

## 2022-08-11 MED ORDER — AMOXICILLIN-POT CLAVULANATE 875-125 MG PO TABS: 1.0000 | ORAL_TABLET | Freq: Two times a day (BID) | ORAL | 0 refills | Status: DC

## 2022-08-11 MED ORDER — HYDROCODONE-ACETAMINOPHEN 5-325 MG PO TABS
1.0000 | ORAL_TABLET | Freq: Once | ORAL | Status: AC
  Administered 2022-08-11: 1 via ORAL
  Filled 2022-08-11: qty 1

## 2022-08-11 MED ORDER — IOHEXOL 300 MG/ML  SOLN
75.0000 mL | Freq: Once | INTRAMUSCULAR | Status: AC | PRN
Start: 2022-08-11 — End: 2022-08-11
  Administered 2022-08-11: 75 mL via INTRAVENOUS

## 2022-08-11 NOTE — ED Notes (Signed)
Patient transported to CT 

## 2022-08-11 NOTE — Discharge Instructions (Addendum)
Your CT scan was negative, and your labs are reassuring. There is low concern for acute infection of your salivary duct, but your symptoms are consistent with sialadenitis, or blocked salivary duct. We will start you on prophylactic antibiotics.  To relieve discomfort Follow these instructions every few hours: Suck on a lemon candy or a vitamin C lozenge to prompt the flow of saliva. Put a warm, wet cloth (warmcompress) over the gland. Gently massage the gland. Gargle with a mixture of salt and water 3-4 times a day or as needed. To make salt water, completely dissolve -1 tsp (3-6 g) of salt in 1 cup (237 mL) of warm water.

## 2022-08-11 NOTE — ED Notes (Signed)
Spoke to lab about pending blood tests, sts that blood is in lab and running it now. Delayed explained to pt.

## 2022-08-11 NOTE — ED Triage Notes (Addendum)
Patient here POV from Home.  Endorses Left Sided Facial Pain for approximately 3 Weeks. Worsened Since. More recently the Patient noted Swelling to Left Side. Palpated by this RN in Triage. No Dental Cavities noted. No Drainage noted. No Oral/Airway Obstruction at this Time.  No Fevers. Mostly Constant. Pain is Now Radiating to Left Ear and Left Eye.   NAD Noted during Triage. A&Ox4. GCS 15. Ambulatory.

## 2022-08-11 NOTE — ED Provider Notes (Signed)
[facility] EMERGENCY DEPT Provider Note   CSN: [id number] Arrival date & time: 08/11/22  1906     History  Chief Complaint  Patient presents with   Facial Pain    Jenna Patterson is a 41 y.o. female.  HPI   41 year old female presenting to the emergency department with left-sided facial pain and swelling for approximately the past 3 weeks.  She states that symptoms have worsened.  She endorses welling and pain to the left side of her face.  She endorses xerostomia.  She denies any history of dental disease.  No history of dental caries.  She denies any dental pain or tooth ache.  She denies any fevers.  Pain is radiating to her left ear and left eye from the left side of her face.  Denies any difficulty swallowing or difficulty opening her mouth.  She denies any obstruction in her airway.  She denies any drainage.  She can feel a tender knot on the left side of her cheek.  Home Medications Prior to Admission medications   Medication Sig Start Date End Date Taking? Authorizing Provider  amoxicillin-clavulanate (AUGMENTIN) 875-125 MG tablet Take 1 tablet by mouth every 12 (twelve) hours. 08/11/22  Yes Regan Lemming, MD  ALPRAZolam Duanne Moron) 0.5 MG tablet Take 0.5 mg at bedtime as needed by mouth for anxiety.    [provider]  amphetamine-dextroamphetamine (ADDERALL) 30 MG tablet Take 30 mg daily by mouth.    [provider]  hydrOXYzine (VISTARIL) 25 MG capsule Take 25 mg by mouth daily.    [provider]  levothyroxine (SYNTHROID) 175 MCG tablet Take 1 tablet (175 mcg total) by mouth daily before breakfast. 04/03/18   Marcille Buffy D, CNM  Semaglutide,0.25 or 0.'5MG'$ /DOS, (OZEMPIC, 0.25 OR 0.5 MG/DOSE,) 2 MG/1.5ML SOPN Inject 2 mg into the skin once a week.    [provider]  Vitamin D, Ergocalciferol, (DRISDOL) 50000 units CAPS capsule Take 50,000 Units by mouth every 7 (seven) days.    [provider]      Allergies     Haloperidol    Review of Systems   Review of Systems  All other systems reviewed and are negative.   Physical Exam Updated Vital Signs BP 128/84   Pulse 98   Temp 98.1 F (36.7 C)   Resp 16   Ht '5\' 2"'$  (1.575 m)   Wt 98 kg   SpO2 100%   BMI 39.52 kg/m  Physical Exam Vitals and nursing note reviewed.  Constitutional:      General: She is not in acute distress.    Appearance: She is well-developed.  HENT:     Head: Normocephalic and atraumatic.     Comments: No trismus, good range of motion of the head and neck, no tongue elevation, some tenderness palpation about the left parotid gland, no rash Eyes:     Conjunctiva/sclera: Conjunctivae normal.  Cardiovascular:     Rate and Rhythm: Normal rate and regular rhythm.     Heart sounds: No murmur heard. Pulmonary:     Effort: Pulmonary effort is normal. No respiratory distress.     Breath sounds: Normal breath sounds.  Abdominal:     Palpations: Abdomen is soft.     Tenderness: There is no abdominal tenderness.  Musculoskeletal:        General: No swelling.     Cervical back: Neck supple.  Skin:    General: Skin is warm and dry.     Capillary Refill:  Capillary refill takes less than 2 seconds.  Neurological:     Mental Status: She is alert.  Psychiatric:        Mood and Affect: Mood normal.     ED Results / Procedures / Treatments   Labs (all labs ordered are listed, but only abnormal results are displayed) Labs Reviewed  BASIC METABOLIC PANEL - Abnormal; Notable for the following components:      Result Value   Potassium 3.3 (*)    All other components within normal limits  CULTURE, BLOOD (SINGLE)  CBC WITH DIFFERENTIAL/PLATELET  HCG, QUANTITATIVE, PREGNANCY    EKG None  Radiology CT Maxillofacial W Contrast  Result Date: 08/11/2022 CLINICAL DATA:  Facial abscess EXAM: CT MAXILLOFACIAL WITH CONTRAST TECHNIQUE: Multidetector CT imaging of the maxillofacial structures was performed with intravenous  contrast. Multiplanar CT image reconstructions were also generated. RADIATION DOSE REDUCTION: This exam was performed according to the departmental dose-optimization program which includes automated exposure control, adjustment of the mA and/or kV according to patient size and/or use of iterative reconstruction technique. CONTRAST:  63m OMNIPAQUE IOHEXOL 300 MG/ML  SOLN COMPARISON:  None Available. FINDINGS: Osseous: No fracture or mandibular dislocation. No destructive process. Orbits: Negative. No traumatic or inflammatory finding. Sinuses: Clear. Soft tissues: Negative. Limited intracranial: No significant or unexpected finding. IMPRESSION: Normal maxillofacial CT.  No abscess or fluid collection. Electronically Signed   By: KUlyses JarredM.D.   On: 08/11/2022 23:10    Procedures Procedures    Medications Ordered in ED Medications  HYDROcodone-acetaminophen (NORCO/VICODIN) 5-325 MG per tablet 1 tablet (1 tablet Oral Given 08/11/22 2154)  iohexol (OMNIPAQUE) 300 MG/ML solution 75 mL (75 mLs Intravenous Contrast Given 08/11/22 2253)    ED Course/ Medical Decision Making/ A&P                           Medical Decision Making Amount and/or Complexity of Data Reviewed Labs: ordered. Radiology: ordered.  Risk Prescription drug management.    41year old female presenting to the emergency department with left-sided facial pain and swelling for approximately the past 3 weeks.  She states that symptoms have worsened.  She endorses welling and pain to the left side of her face.  She endorses xerostomia.  She denies any history of dental disease.  No history of dental caries.  She denies any dental pain or tooth ache.  She denies any fevers.  Pain is radiating to her left ear and left eye from the left side of her face.  Denies any difficulty swallowing or difficulty opening her mouth.  She denies any obstruction in her airway.  She denies any drainage.  She can feel a tender knot on the left side of  her cheek.  On arrival, the patient was vitally stable, mildly tachycardic P1 11, subsequently improved without intervention, mildly hypertensive BP 156/99, subsequently improved.  Sinus rhythm noted on cardiac telemetry on my evaluation.  Physical exam significant for a well-appearing female in no distress, no trismus, no tongue swelling or elevation, good range of motion of the head or neck, mild tenderness palpation about the left parotid.  Differential diagnosis includes sialoadenitis, parotid mass, less likely facial or odontogenic abscess, likely herpes zoster, TMJ.   IV access was obtained and a CT of the face with contrast was ordered.  She is tolerating oral intake.  Laboratory evaluation significant for BMP with mild hypokalemia to 3.3, otherwise unremarkable, CBC without leukocytosis or anemia.  A blood  culture was collected and pending.  The patient was administered Norco for pain control.  CT of the face was performed and resulted negative for acute abnormality.  Patient symptoms are most consistent with likely sialoadenitis.  We will prophylactically treat for infected salivary stone with antibiotics and have the patient follow-up with her PCP.  Advised continued treatment with sialagogues.  Overall stable for discharge.  Final Clinical Impression(s) / ED Diagnoses Final diagnoses:  Sialadenitis    Rx / DC Orders ED Discharge Orders          Ordered    amoxicillin-clavulanate (AUGMENTIN) 875-125 MG tablet  Every 12 hours        08/11/22 2317              Regan Lemming, MD 08/11/22 2343

## 2022-08-11 NOTE — ED Notes (Signed)
Pt back from scan 

## 2022-08-17 LAB — CULTURE, BLOOD (SINGLE): Culture: NO GROWTH

## 2022-08-25 ENCOUNTER — Encounter (HOSPITAL_BASED_OUTPATIENT_CLINIC_OR_DEPARTMENT_OTHER): Payer: Self-pay | Admitting: Obstetrics & Gynecology

## 2022-08-25 ENCOUNTER — Ambulatory Visit (INDEPENDENT_AMBULATORY_CARE_PROVIDER_SITE_OTHER): Admitting: Obstetrics & Gynecology

## 2022-08-25 ENCOUNTER — Other Ambulatory Visit (HOSPITAL_BASED_OUTPATIENT_CLINIC_OR_DEPARTMENT_OTHER): Payer: Self-pay | Admitting: Obstetrics & Gynecology

## 2022-08-25 ENCOUNTER — Other Ambulatory Visit (HOSPITAL_COMMUNITY)
Admission: RE | Admit: 2022-08-25 | Discharge: 2022-08-25 | Disposition: A | Discharge status: Home / Self Care | Source: Ambulatory Visit | Attending: Obstetrics & Gynecology | Admitting: Obstetrics & Gynecology

## 2022-08-25 ENCOUNTER — Ambulatory Visit (INDEPENDENT_AMBULATORY_CARE_PROVIDER_SITE_OTHER)

## 2022-08-25 VITALS — BP 123/86 | HR 108 | Ht 62.0 in | Wt 196.8 lb

## 2022-08-25 DIAGNOSIS — R9389 Abnormal findings on diagnostic imaging of other specified body structures: Secondary | ICD-10-CM

## 2022-08-25 DIAGNOSIS — Z124 Encounter for screening for malignant neoplasm of cervix: Secondary | ICD-10-CM

## 2022-08-25 DIAGNOSIS — N920 Excessive and frequent menstruation with regular cycle: Secondary | ICD-10-CM

## 2022-08-25 MED ORDER — NORETHINDRONE 0.35 MG PO TABS: 1.0000 | ORAL_TABLET | Freq: Every day | ORAL | 1 refills | Status: DC

## 2022-08-27 LAB — SURGICAL PATHOLOGY

## 2022-08-30 LAB — CYTOLOGY - PAP
Comment: NEGATIVE
Diagnosis: NEGATIVE
High risk HPV: NEGATIVE

## 2022-08-31 NOTE — Progress Notes (Signed)
GYNECOLOGY  VISIT  CC:   follow up after ultrasound   HPI: 41 y.o. G46P1011 Married White or Caucasian female here for discussion of ultrasound results done due to menorrhagia and dysmenorrhea.  Ultrasound shows normal uterus without any abnormal myometrial findings.  Ovaries are normal but best visualized by transabdominal imaging.  Endometrium 14m which does appear thickened given date of LMP.  With menorrhagia h/o, endometrial biopsy recommended today.  Pt comfortable with plan.  Also needs pap smear completed as well.  Treatment options discussed.  Pt interested in hysterectomy.  Recommend considering more conservative options given prior surgical hx.  Endometrial ablation has been suggested by another provider.  I am not sure, given pain that she experiencing, that this is best option as she could have worsening pain after this and would then have fewer options for treatment.  Open to considering progesterone only pill.   Past Medical History:  Diagnosis Date   Adenomatous polyp    Hypothyroidism    Kidney stone    LGA (large for gestational age) fetus affecting management of mother 08/09/2017   At 314 weeks(08/09/2017): Estimated fetal weight is 3235g which is growth in the 83rd  percentile. AC is in the 95th percentile '[ ]'$  repeat scan in 4 weeks   Mitral valve regurgitation    trace amount by echo in 2013   Normal cardiac stress test    EF 65%, no ischemia [facility]/09/2012 [facility]  [place]    Thyroid disease    Ventricular hypertrophy left   mild LVH seen on previous echo on 04/18/2012 [facility]    MEDS:   Current Outpatient Medications on File Prior to Visit  Medication Sig Dispense Refill   ALPRAZolam (XANAX) 0.5 MG tablet Take 0.5 mg at bedtime as needed by mouth for anxiety.     amoxicillin-clavulanate (AUGMENTIN) 875-125 MG tablet Take 1 tablet by mouth every 12 (twelve) hours. 14 tablet 0   amphetamine-dextroamphetamine (ADDERALL) 30 MG  tablet Take 30 mg daily by mouth.     hydrOXYzine (VISTARIL) 25 MG capsule Take 25 mg by mouth daily.     levothyroxine (SYNTHROID) 175 MCG tablet Take 1 tablet (175 mcg total) by mouth daily before breakfast. 30 tablet 2   Semaglutide,0.25 or 0.'5MG'$ /DOS, (OZEMPIC, 0.25 OR 0.5 MG/DOSE,) 2 MG/1.5ML SOPN Inject 2 mg into the skin once a week.     Vitamin D, Ergocalciferol, (DRISDOL) 50000 units CAPS capsule Take 50,000 Units by mouth every 7 (seven) days.     Current Facility-Administered Medications on File Prior to Visit  Medication Dose Route Frequency Provider Last Rate Last Admin   Vitamin D (Ergocalciferol) (DRISDOL) capsule 50,000 Units  50,000 Units Oral Q7 days [place] CNM        ALLERGIES: Haloperidol  SH:  married, non smoker  Review of Systems  Constitutional: Negative.   Genitourinary:        Menorrhagia and painful menses    PHYSICAL EXAMINATION:    BP 123/86 (BP Location: Left Arm, Patient Position: Sitting, Cuff Size: Large)   Pulse (!) 108   Ht '5\' 2"'$  (1.575 m) Comment: Reported  Wt 196 lb 12.8 oz (89.3 kg)   BMI 36.00 kg/m     General appearance: alert, cooperative and appears stated age  Lymph:  no inguinal LAD noted  Pelvic: External genitalia:  no lesions              Urethra:  normal  appearing urethra with no masses, tenderness or lesions              Bartholins and Skenes: normal                 Vagina: normal appearing vagina with normal color and discharge, no lesions              Cervix: no lesions              Bimanual Exam:  Uterus:  normal size, contour, position, consistency, mobility, non-tender              Adnexa: no mass, fullness, tenderness               Endometrial biopsy recommended.  Discussed with patient.  Verbal and written consent obtained.   Procedure:  Speculum placed.  Cervix visualized and cleansed with betadine prep.  A single toothed tenaculum was applied to the anterior lip of the cervix.  Endometrial pipelle was  advanced through the cervix into the endometrial cavity without difficulty.  Pipelle passed to 8.0cm.  Suction applied and pipelle removed with good tissue sample obtained.  Tenculum removed.  No bleeding noted.  Patient tolerated procedure well.  Chaperone, Octaviano Batty, CMA, was present for exam.  Assessment/Plan: - Cytology - PAP( [employer])  2. Menorrhagia with regular cycle - will start micronor daily and plan follow up 3 months if biopsy is negative. - Surgical pathology( [facility]/ [facility])

## 2022-09-16 ENCOUNTER — Emergency Department (HOSPITAL_BASED_OUTPATIENT_CLINIC_OR_DEPARTMENT_OTHER)

## 2022-09-16 ENCOUNTER — Emergency Department (HOSPITAL_BASED_OUTPATIENT_CLINIC_OR_DEPARTMENT_OTHER)
Admission: EM | Admit: 2022-09-16 | Discharge: 2022-09-16 | Disposition: A | Discharge status: Home / Self Care | Attending: Emergency Medicine | Admitting: Emergency Medicine

## 2022-09-16 ENCOUNTER — Encounter (HOSPITAL_BASED_OUTPATIENT_CLINIC_OR_DEPARTMENT_OTHER): Payer: Self-pay | Admitting: Emergency Medicine

## 2022-09-16 ENCOUNTER — Other Ambulatory Visit: Payer: Self-pay

## 2022-09-16 DIAGNOSIS — R110 Nausea: Secondary | ICD-10-CM | POA: Diagnosis not present

## 2022-09-16 DIAGNOSIS — R1900 Intra-abdominal and pelvic swelling, mass and lump, unspecified site: Secondary | ICD-10-CM

## 2022-09-16 DIAGNOSIS — K689 Other disorders of retroperitoneum: Secondary | ICD-10-CM | POA: Insufficient documentation

## 2022-09-16 DIAGNOSIS — E039 Hypothyroidism, unspecified: Secondary | ICD-10-CM | POA: Insufficient documentation

## 2022-09-16 DIAGNOSIS — R1013 Epigastric pain: Secondary | ICD-10-CM | POA: Diagnosis not present

## 2022-09-16 DIAGNOSIS — Z79899 Other long term (current) drug therapy: Secondary | ICD-10-CM | POA: Diagnosis not present

## 2022-09-16 DIAGNOSIS — Z794 Long term (current) use of insulin: Secondary | ICD-10-CM | POA: Diagnosis not present

## 2022-09-16 LAB — URINALYSIS, ROUTINE W REFLEX MICROSCOPIC
Bilirubin Urine: NEGATIVE
Glucose, UA: NEGATIVE mg/dL
Ketones, ur: 40 mg/dL — AB
Leukocytes,Ua: NEGATIVE
Nitrite: NEGATIVE
Protein, ur: NEGATIVE mg/dL
Specific Gravity, Urine: 1.01 (ref 1.005–1.030)
pH: 7 (ref 5.0–8.0)

## 2022-09-16 LAB — CBC WITH DIFFERENTIAL/PLATELET
Abs Immature Granulocytes: 0.02 10*3/uL (ref 0.00–0.07)
Basophils Absolute: 0.1 10*3/uL (ref 0.0–0.1)
Basophils Relative: 1 %
Eosinophils Absolute: 0.1 10*3/uL (ref 0.0–0.5)
Eosinophils Relative: 2 %
HCT: 41.2 % (ref 36.0–46.0)
Hemoglobin: 13.7 g/dL (ref 12.0–15.0)
Immature Granulocytes: 0 %
Lymphocytes Relative: 28 %
Lymphs Abs: 2.3 10*3/uL (ref 0.7–4.0)
MCH: 29.5 pg (ref 26.0–34.0)
MCHC: 33.3 g/dL (ref 30.0–36.0)
MCV: 88.6 fL (ref 80.0–100.0)
Monocytes Absolute: 0.5 10*3/uL (ref 0.1–1.0)
Monocytes Relative: 6 %
Neutro Abs: 5.1 10*3/uL (ref 1.7–7.7)
Neutrophils Relative %: 63 %
Platelets: 406 10*3/uL — ABNORMAL HIGH (ref 150–400)
RBC: 4.65 MIL/uL (ref 3.87–5.11)
RDW: 14.6 % (ref 11.5–15.5)
WBC: 8 10*3/uL (ref 4.0–10.5)
nRBC: 0 % (ref 0.0–0.2)

## 2022-09-16 LAB — URINALYSIS, MICROSCOPIC (REFLEX)

## 2022-09-16 LAB — COMPREHENSIVE METABOLIC PANEL
ALT: 19 U/L (ref 0–44)
AST: 18 U/L (ref 15–41)
Albumin: 4 g/dL (ref 3.5–5.0)
Alkaline Phosphatase: 55 U/L (ref 38–126)
Anion gap: 6 (ref 5–15)
BUN: 12 mg/dL (ref 6–20)
CO2: 25 mmol/L (ref 22–32)
Calcium: 8.8 mg/dL — ABNORMAL LOW (ref 8.9–10.3)
Chloride: 107 mmol/L (ref 98–111)
Creatinine, Ser: 0.53 mg/dL (ref 0.44–1.00)
GFR, Estimated: >60 mL/min (ref >60)
Glucose, Bld: 117 mg/dL — ABNORMAL HIGH (ref 70–99)
Potassium: 3.7 mmol/L (ref 3.5–5.1)
Sodium: 138 mmol/L (ref 135–145)
Total Bilirubin: 0.4 mg/dL (ref 0.3–1.2)
Total Protein: 6.8 g/dL (ref 6.5–8.1)

## 2022-09-16 LAB — LIPASE, BLOOD: Lipase: 44 U/L (ref 11–51)

## 2022-09-16 LAB — PREGNANCY, URINE: Preg Test, Ur: NEGATIVE

## 2022-09-16 MED ORDER — ONDANSETRON HCL 4 MG/2ML IJ SOLN
4.0000 mg | Freq: Once | INTRAMUSCULAR | Status: AC
Start: 2022-09-16 — End: 2022-09-16
  Administered 2022-09-16: 4 mg via INTRAVENOUS
  Filled 2022-09-16: qty 2

## 2022-09-16 MED ORDER — PANTOPRAZOLE SODIUM 40 MG PO TBEC: 40.0000 mg | DELAYED_RELEASE_TABLET | Freq: Every day | ORAL | 0 refills | Status: DC

## 2022-09-16 MED ORDER — IOHEXOL 300 MG/ML  SOLN
100.0000 mL | Freq: Once | INTRAMUSCULAR | Status: AC | PRN
  Administered 2022-09-16: 100 mL via INTRAVENOUS

## 2022-09-16 MED ORDER — DICYCLOMINE HCL 20 MG PO TABS: 20.0000 mg | ORAL_TABLET | Freq: Two times a day (BID) | ORAL | 0 refills | Status: DC

## 2022-09-16 MED ORDER — MORPHINE SULFATE (PF) 4 MG/ML IV SOLN
4.0000 mg | Freq: Once | INTRAVENOUS | Status: AC
  Administered 2022-09-16: 4 mg via INTRAVENOUS
  Filled 2022-09-16: qty 1

## 2022-09-16 MED ORDER — HYDROMORPHONE HCL 1 MG/ML IJ SOLN
1.0000 mg | Freq: Once | INTRAMUSCULAR | Status: AC
  Administered 2022-09-16: 1 mg via INTRAVENOUS
  Filled 2022-09-16: qty 1

## 2022-09-16 MED ORDER — ONDANSETRON HCL 4 MG/2ML IJ SOLN
4.0000 mg | Freq: Once | INTRAMUSCULAR | Status: AC
  Administered 2022-09-16: 4 mg via INTRAVENOUS
  Filled 2022-09-16: qty 2

## 2022-09-16 MED ORDER — LACTATED RINGERS IV BOLUS
1000.0000 mL | Freq: Once | INTRAVENOUS | Status: AC
  Administered 2022-09-16: 1000 mL via INTRAVENOUS

## 2022-09-16 MED ORDER — ONDANSETRON 4 MG PO TBDP: 4.0000 mg | ORAL_TABLET | Freq: Three times a day (TID) | ORAL | 0 refills | Status: DC | PRN

## 2022-09-16 NOTE — ED Triage Notes (Signed)
Patient arrived via POV c/o RUQ abdominal pain x 2 hrs. Patient states pain under right breast. Patient tearful during triage. Patient states 10/10 pain. Patient endorses nausea. Patient last took goody powder at 0600. Patient is AO x 4 VS WDL, unable to walk.

## 2022-09-16 NOTE — ED Provider Notes (Signed)
[facility] EMERGENCY DEPARTMENT Provider Note   CSN: [id number] Arrival date & time: 09/16/22  5621     History  Chief Complaint  Patient presents with   Abdominal Pain    Falana Clagg is a 41 y.o. female.  HPI      41yo female wit history of hypothyroidism, who presents with concern for abdominal pain.   9/12 had menorrhagia evaluation with GYN and started on micronor daily  Severe right upper quadrant pain, radiating into the epigastric region and back Woke up out of sleep this AM with it 2 hours of pain No help with goody powder Had this pain 2 nights ago, improved after sleep  Nausea, no vomiting, no diarrhea/constipation/urinary symptoms/fevers/CP/dyspnea  Mom, sisters have had GB out  Past Medical History:  Diagnosis Date   Adenomatous polyp    Hypothyroidism    Kidney stone    LGA (large for gestational age) fetus affecting management of mother 08/09/2017   At 91 weeks (08/09/2017): Estimated fetal weight is 3235g which is growth in the 83rd  percentile. AC is in the 95th percentile '[ ]'$  repeat scan in 4 weeks   Mitral valve regurgitation    trace amount by echo in 2013   Normal cardiac stress test    EF 65%, no ischemia 05/09/2012 at [place]   Palpitation    Thyroid disease    Ventricular hypertrophy left   mild LVH seen on previous echo on 04/18/2012 at [place] in [facility]    Past Surgical History:  Procedure Laterality Date   [place] N/A 08/26/2017   Procedure: [place];  Surgeon: Chancy Milroy, MD;  Location: [facility];  Service: Obstetrics;  Laterality: N/A;   DILATION AND CURETTAGE OF UTERUS     TUBAL LIGATION Bilateral 08/26/2017   Procedure: BILATERAL TUBAL LIGATION;  Surgeon: Chancy Milroy, MD;  Location: [facility];  Service: Obstetrics;  Laterality: Bilateral;    Home Medications Prior to Admission medications   Medication Sig Start Date End  Date Taking? Authorizing Provider  dicyclomine (BENTYL) 20 MG tablet Take 1 tablet (20 mg total) by mouth 2 (two) times daily. 09/16/22  Yes Gareth Morgan, MD  ondansetron (ZOFRAN-ODT) 4 MG disintegrating tablet Take 1 tablet (4 mg total) by mouth every 8 (eight) hours as needed for nausea or vomiting. 09/16/22  Yes Gareth Morgan, MD  pantoprazole (PROTONIX) 40 MG tablet Take 1 tablet (40 mg total) by mouth daily for 14 days. 09/16/22 09/30/22 Yes Gareth Morgan, MD  ALPRAZolam Duanne Moron) 0.5 MG tablet Take 0.5 mg at bedtime as needed by mouth for anxiety.    [provider]  amoxicillin-clavulanate (AUGMENTIN) 875-125 MG tablet Take 1 tablet by mouth every 12 (twelve) hours. 08/11/22   Regan Lemming, MD  amphetamine-dextroamphetamine (ADDERALL) 30 MG tablet Take 30 mg daily by mouth.    [provider]  hydrOXYzine (VISTARIL) 25 MG capsule Take 25 mg by mouth daily.    [provider]  levothyroxine (SYNTHROID) 175 MCG tablet Take 1 tablet (175 mcg total) by mouth daily before breakfast. 04/03/18   Tresea Mall, CNM  norethindrone (MICRONOR) 0.35 MG tablet Take 1 tablet (0.35 mg total) by mouth daily. 08/25/22   Megan Salon, MD  Semaglutide,0.25 or 0.'5MG'$ /DOS, (OZEMPIC, 0.25 OR 0.5 MG/DOSE,) 2 MG/1.5ML SOPN Inject 2 mg into the skin once a week.    [provider]  Vitamin D, Ergocalciferol, (DRISDOL)  50000 units CAPS capsule Take 50,000 Units by mouth every 7 (seven) days.    [provider]      Allergies    Haloperidol    Review of Systems   Review of Systems  Physical Exam Updated Vital Signs BP 122/75   Pulse 89   Temp 98.2 F (36.8 C)   Resp 18   Ht '5\' 2"'$  (1.575 m)   Wt 86.2 kg   LMP 09/05/2022 (Exact Date)   SpO2 100%   BMI 34.75 kg/m  Physical Exam Vitals and nursing note reviewed.  Constitutional:      General: She is in acute distress (pain).     Appearance: She is well-developed. She is not diaphoretic.  HENT:      Head: Normocephalic and atraumatic.  Eyes:     Conjunctiva/sclera: Conjunctivae normal.  Cardiovascular:     Rate and Rhythm: Normal rate and regular rhythm.     Heart sounds: Normal heart sounds. No murmur heard.    No friction rub. No gallop.  Pulmonary:     Effort: Pulmonary effort is normal. No respiratory distress.     Breath sounds: Normal breath sounds. No wheezing or rales.  Abdominal:     General: There is no distension.     Palpations: Abdomen is soft.     Tenderness: There is abdominal tenderness in the right upper quadrant and epigastric area. There is no guarding. Positive signs include Murphy's sign. Negative signs include McBurney's sign.     Comments: Scattered old contusions (reports from moving furniture)   Musculoskeletal:        General: No tenderness.     Cervical back: Normal range of motion.  Skin:    General: Skin is warm and dry.     Findings: No erythema or rash.  Neurological:     Mental Status: She is alert and oriented to person, place, and time.     ED Results / Procedures / Treatments   Labs (all labs ordered are listed, but only abnormal results are displayed) Labs Reviewed  CBC WITH DIFFERENTIAL/PLATELET - Abnormal; Notable for the following components:      Result Value   Platelets 406 (*)    All other components within normal limits  URINALYSIS, ROUTINE W REFLEX MICROSCOPIC - Abnormal; Notable for the following components:   Hgb urine dipstick TRACE (*)    Ketones, ur 40 (*)    All other components within normal limits  COMPREHENSIVE METABOLIC PANEL - Abnormal; Notable for the following components:   Glucose, Bld 117 (*)    Calcium 8.8 (*)    All other components within normal limits  URINALYSIS, MICROSCOPIC (REFLEX) - Abnormal; Notable for the following components:   Bacteria, UA FEW (*)    All other components within normal limits  PREGNANCY, URINE  LIPASE, BLOOD    EKG EKG Interpretation  Date/Time:  Thursday September 16 2022  07:27:02 EDT Ventricular Rate:  86 PR Interval:  163 QRS Duration: 92 QT Interval:  385 QTC Calculation: 461 R Axis:   34 Text Interpretation: Sinus rhythm Low voltage, precordial leads Confirmed by Gareth Morgan [phone]) on 09/16/2022 8:29:34 AM  Radiology CT ABDOMEN PELVIS W CONTRAST  Result Date: 09/16/2022 CLINICAL DATA:  Abdominal pain, acute, nonlocalized EXAM: CT ABDOMEN AND PELVIS WITH CONTRAST TECHNIQUE: Multidetector CT imaging of the abdomen and pelvis was performed using the standard protocol following bolus administration of intravenous contrast. RADIATION DOSE REDUCTION: This exam was performed according to the departmental dose-optimization  program which includes automated exposure control, adjustment of the mA and/or kV according to patient size and/or use of iterative reconstruction technique. CONTRAST:  169m OMNIPAQUE IOHEXOL 300 MG/ML  SOLN COMPARISON:  Same day right upper quadrant ultrasound FINDINGS: Lower chest: No acute abnormality. Hepatobiliary: No focal liver abnormality is seen. The gallbladder is unremarkable. Pancreas: Unremarkable. No pancreatic ductal dilatation or surrounding inflammatory changes. Spleen: Normal in size without focal abnormality. Adrenals/Urinary Tract: Adrenal glands are unremarkable. No hydronephrosis or nephrolithiasis. The bladder is moderately distended. Stomach/Bowel: The stomach is within normal limits. There is no evidence of bowel obstruction.The appendix is normal. Vascular/Lymphatic: No significant vascular findings are present. No pathologically enlarged lymph nodes. There are fatty lymph nodes in the left periaortic retroperitoneum. Reproductive: Bilateral tubal ligation.  No adnexal mass. Other: No abdominal wall hernia. No ascites. No free air. There is prominent retroperitoneal fat tissue along the left common iliac artery, with mild mass effect on the adjacent mesenteric vessels. This is similar in appearance to prior exam in October  2015, though with slightly increased AP diameter, measuring up to 3.0 cm, previously 2.3 cm (series 2, image 63). Musculoskeletal: No acute or significant osseous findings. IMPRESSION: No acute findings in the abdomen or pelvis. Prominent retroperitoneal lipomatous tissue, with mild associated mass effect at the level of the left common iliac artery, slightly increased in size in comparison to prior exam in October 2015, measuring 3.0 cm in AP dimension, previously 2.3 cm. 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 Recommend follow-up CT abdomen pelvis in 12 months to assess for interval change. Electronically Signed   By: JMaurine SimmeringM.D.   On: 09/16/2022 09:41   [place]domen Limited RUQ (LIVER/GB)  Result Date: 09/16/2022 CLINICAL DATA:  41year old female with right upper quadrant pain and nausea. EXAM: ULTRASOUND ABDOMEN LIMITED RIGHT UPPER QUADRANT COMPARISON:  CT Abdomen and Pelvis 10/08/2014. FINDINGS: Gallbladder: No gallstones or wall thickening visualized. No sonographic Murphy sign noted by sonographer. Common bile duct: Diameter: 3 mm, normal. Liver: Mildly echogenic and heterogeneous liver. No discrete liver lesion. Portal vein is patent on color Doppler imaging with normal direction of blood flow towards the liver. Other: Grossly negative visible right kidney.  No free fluid. IMPRESSION: Possible Hepatic steatosis. Otherwise negative right upper quadrant ultrasound. Electronically Signed   By: HGenevie AnnM.D.   On: 09/16/2022 08:36    Procedures Procedures    Medications Ordered in ED Medications  lactated ringers bolus 1,000 mL (0 mLs Intravenous Stopped 09/16/22 0919)  morphine (PF) 4 MG/ML injection 4 mg (4 mg Intravenous Given 09/16/22 0734)  ondansetron (ZOFRAN) injection 4 mg (4 mg Intravenous Given 09/16/22 0732)  iohexol (OMNIPAQUE) 300 MG/ML  solution 100 mL (100 mLs Intravenous Contrast Given 09/16/22 0902)  HYDROmorphone (DILAUDID) injection 1 mg (1 mg Intravenous Given 09/16/22 0918)  ondansetron (ZOFRAN) injection 4 mg (4 mg Intravenous Given 09/16/22 1029)    ED Course/ Medical Decision Making/ A&P                             89yofemale wit history of hypothyroidism, who presents with concern for abdominal pain.   DDx includes appendicitis, pancreatitis, cholecystitis, symptomatic cholelithiasis, choledocolithiasis, cholangitis, pyelonephritis, nephrolithiasis, diverticulitis, SBO, PID, ovarian torsion, ectopic pregnancy, and tuboovarian abscess   Labs completed and personally evaluated by me shows no  hepatitis, no pancreatitis, no significant lab abnormalities, no evidence of leukocytosis.  Urine pregnancy test negative. Urinalysis without infection. EKG personally interpreted by me without acute ST changes. Tenderness to abdomen, doubt ACS/PE.  Pain in RUQ, pos Murphy's sign, with highest clinical suspicion initially for GB pathology.  RUQ [place] with possible hepatic steatosis, otherwise negative.    CT abdomen pelvis ordered showing no acute findings, does show prominent retroperitoneal lipomatous tissue, mildmass effect left ommon iliac arteriy, slightly increased since 2015, recommend follow up CT in one year and PCP follow up .Do not suspect ovarian torsion or other pelvic etiology of pain given location.  Consider possible PUD/gastritis. Given r for bentyl, zofran, PPI. Patient discharged in stable condition with understanding of reasons to return.          Final Clinical Impression(s) / ED Diagnoses Final diagnoses:  Nausea  Epigastric pain  Retroperitoneal mass    Rx / DC Orders ED Discharge Orders          Ordered    dicyclomine (BENTYL) 20 MG tablet  2 times daily        09/16/22 1137    ondansetron (ZOFRAN-ODT) 4 MG disintegrating tablet  Every 8 hours PRN        09/16/22 1137    pantoprazole  (PROTONIX) 40 MG tablet  Daily        09/16/22 1137              Gareth Morgan, MD 09/16/22 2334

## 2022-10-22 ENCOUNTER — Emergency Department (HOSPITAL_BASED_OUTPATIENT_CLINIC_OR_DEPARTMENT_OTHER)

## 2022-10-22 ENCOUNTER — Other Ambulatory Visit: Payer: Self-pay

## 2022-10-22 ENCOUNTER — Encounter (HOSPITAL_BASED_OUTPATIENT_CLINIC_OR_DEPARTMENT_OTHER): Payer: Self-pay

## 2022-10-22 ENCOUNTER — Emergency Department (HOSPITAL_BASED_OUTPATIENT_CLINIC_OR_DEPARTMENT_OTHER)
Admission: EM | Admit: 2022-10-22 | Discharge: 2022-10-23 | Disposition: A | Discharge status: Home / Self Care | Source: Home / Self Care | Attending: Emergency Medicine | Admitting: Emergency Medicine

## 2022-10-22 ENCOUNTER — Emergency Department (HOSPITAL_BASED_OUTPATIENT_CLINIC_OR_DEPARTMENT_OTHER)
Admission: EM | Admit: 2022-10-22 | Discharge: 2022-10-22 | Disposition: A | Discharge status: Home / Self Care | Attending: Emergency Medicine | Admitting: Emergency Medicine

## 2022-10-22 ENCOUNTER — Encounter (HOSPITAL_BASED_OUTPATIENT_CLINIC_OR_DEPARTMENT_OTHER): Payer: Self-pay | Admitting: Emergency Medicine

## 2022-10-22 DIAGNOSIS — I10 Essential (primary) hypertension: Secondary | ICD-10-CM | POA: Insufficient documentation

## 2022-10-22 DIAGNOSIS — E039 Hypothyroidism, unspecified: Secondary | ICD-10-CM | POA: Insufficient documentation

## 2022-10-22 DIAGNOSIS — Z7985 Long-term (current) use of injectable non-insulin antidiabetic drugs: Secondary | ICD-10-CM | POA: Insufficient documentation

## 2022-10-22 DIAGNOSIS — R1013 Epigastric pain: Secondary | ICD-10-CM | POA: Insufficient documentation

## 2022-10-22 DIAGNOSIS — R1011 Right upper quadrant pain: Secondary | ICD-10-CM | POA: Insufficient documentation

## 2022-10-22 DIAGNOSIS — Z79899 Other long term (current) drug therapy: Secondary | ICD-10-CM | POA: Insufficient documentation

## 2022-10-22 DIAGNOSIS — K8050 Calculus of bile duct without cholangitis or cholecystitis without obstruction: Secondary | ICD-10-CM | POA: Insufficient documentation

## 2022-10-22 LAB — COMPREHENSIVE METABOLIC PANEL
ALT: 17 U/L (ref 0–44)
ALT: 17 U/L (ref 0–44)
AST: 17 U/L (ref 15–41)
AST: 17 U/L (ref 15–41)
Albumin: 4.6 g/dL (ref 3.5–5.0)
Albumin: 4.1 g/dL (ref 3.5–5.0)
Alkaline Phosphatase: 71 U/L (ref 38–126)
Alkaline Phosphatase: 71 U/L (ref 38–126)
Anion gap: 10 (ref 5–15)
Anion gap: 7 (ref 5–15)
BUN: 9 mg/dL (ref 6–20)
BUN: 9 mg/dL (ref 6–20)
CO2: 26 mmol/L (ref 22–32)
CO2: 25 mmol/L (ref 22–32)
Calcium: 9.4 mg/dL (ref 8.9–10.3)
Calcium: 8.9 mg/dL (ref 8.9–10.3)
Chloride: 102 mmol/L (ref 98–111)
Chloride: 107 mmol/L (ref 98–111)
Creatinine, Ser: 0.57 mg/dL (ref 0.44–1.00)
Creatinine, Ser: 0.72 mg/dL (ref 0.44–1.00)
GFR, Estimated: >60 mL/min (ref >60)
GFR, Estimated: >60 mL/min (ref >60)
Glucose, Bld: 100 mg/dL — ABNORMAL HIGH (ref 70–99)
Glucose, Bld: 100 mg/dL — ABNORMAL HIGH (ref 70–99)
Potassium: 3.9 mmol/L (ref 3.5–5.1)
Potassium: 3.8 mmol/L (ref 3.5–5.1)
Sodium: 138 mmol/L (ref 135–145)
Sodium: 139 mmol/L (ref 135–145)
Total Bilirubin: 0.4 mg/dL (ref 0.3–1.2)
Total Bilirubin: 0.3 mg/dL (ref 0.3–1.2)
Total Protein: 7.5 g/dL (ref 6.5–8.1)
Total Protein: 7.5 g/dL (ref 6.5–8.1)

## 2022-10-22 LAB — CBC
HCT: 42.5 % (ref 36.0–46.0)
Hemoglobin: 14.2 g/dL (ref 12.0–15.0)
MCH: 29.5 pg (ref 26.0–34.0)
MCHC: 33.4 g/dL (ref 30.0–36.0)
MCV: 88.4 fL (ref 80.0–100.0)
Platelets: 333 10*3/uL (ref 150–400)
RBC: 4.81 MIL/uL (ref 3.87–5.11)
RDW: 12.6 % (ref 11.5–15.5)
WBC: 10.2 10*3/uL (ref 4.0–10.5)
nRBC: 0 % (ref 0.0–0.2)

## 2022-10-22 LAB — URINALYSIS, ROUTINE W REFLEX MICROSCOPIC
Bilirubin Urine: NEGATIVE
Glucose, UA: NEGATIVE mg/dL
Hgb urine dipstick: NEGATIVE
Ketones, ur: NEGATIVE mg/dL
Leukocytes,Ua: NEGATIVE
Nitrite: NEGATIVE
Protein, ur: NEGATIVE mg/dL
Specific Gravity, Urine: 1.014 (ref 1.005–1.030)
pH: 7 (ref 5.0–8.0)

## 2022-10-22 LAB — LIPASE, BLOOD
Lipase: 24 U/L (ref 11–51)
Lipase: 36 U/L (ref 11–51)

## 2022-10-22 LAB — CBC WITH DIFFERENTIAL/PLATELET
Abs Immature Granulocytes: 0.02 10*3/uL (ref 0.00–0.07)
Basophils Absolute: 0.1 10*3/uL (ref 0.0–0.1)
Basophils Relative: 1 %
Eosinophils Absolute: 0.1 10*3/uL (ref 0.0–0.5)
Eosinophils Relative: 1 %
HCT: 40.9 % (ref 36.0–46.0)
Hemoglobin: 13.7 g/dL (ref 12.0–15.0)
Immature Granulocytes: 0 %
Lymphocytes Relative: 21 %
Lymphs Abs: 2.1 10*3/uL (ref 0.7–4.0)
MCH: 29.8 pg (ref 26.0–34.0)
MCHC: 33.5 g/dL (ref 30.0–36.0)
MCV: 89.1 fL (ref 80.0–100.0)
Monocytes Absolute: 0.9 10*3/uL (ref 0.1–1.0)
Monocytes Relative: 9 %
Neutro Abs: 6.8 10*3/uL (ref 1.7–7.7)
Neutrophils Relative %: 68 %
Platelets: 358 10*3/uL (ref 150–400)
RBC: 4.59 MIL/uL (ref 3.87–5.11)
RDW: 12.9 % (ref 11.5–15.5)
WBC: 10.1 10*3/uL (ref 4.0–10.5)
nRBC: 0 % (ref 0.0–0.2)

## 2022-10-22 LAB — PREGNANCY, URINE: Preg Test, Ur: NEGATIVE

## 2022-10-22 MED ORDER — LIDOCAINE VISCOUS HCL 2 % MT SOLN
15.0000 mL | Freq: Once | OROMUCOSAL | Status: AC
  Administered 2022-10-22: 15 mL via ORAL
  Filled 2022-10-22: qty 15

## 2022-10-22 MED ORDER — FAMOTIDINE IN NACL 20-0.9 MG/50ML-% IV SOLN
20.0000 mg | Freq: Once | INTRAVENOUS | Status: AC
  Administered 2022-10-22: 20 mg via INTRAVENOUS
  Filled 2022-10-22: qty 50

## 2022-10-22 MED ORDER — MORPHINE SULFATE (PF) 4 MG/ML IV SOLN
4.0000 mg | Freq: Once | INTRAVENOUS | Status: AC
  Administered 2022-10-22: 4 mg via INTRAVENOUS
  Filled 2022-10-22: qty 1

## 2022-10-22 MED ORDER — ONDANSETRON HCL 4 MG/2ML IJ SOLN
4.0000 mg | Freq: Once | INTRAMUSCULAR | Status: AC
  Administered 2022-10-22: 4 mg via INTRAVENOUS
  Filled 2022-10-22: qty 2

## 2022-10-22 MED ORDER — IOHEXOL 300 MG/ML  SOLN
100.0000 mL | Freq: Once | INTRAMUSCULAR | Status: AC | PRN
  Administered 2022-10-22: 100 mL via INTRAVENOUS

## 2022-10-22 MED ORDER — ALUM & MAG HYDROXIDE-SIMETH 200-200-20 MG/5ML PO SUSP
30.0000 mL | Freq: Once | ORAL | Status: AC
  Administered 2022-10-22: 30 mL via ORAL
  Filled 2022-10-22: qty 30

## 2022-10-22 MED ORDER — FAMOTIDINE 20 MG PO TABS
20.0000 mg | ORAL_TABLET | Freq: Once | ORAL | Status: AC
  Administered 2022-10-22: 20 mg via ORAL
  Filled 2022-10-22: qty 1

## 2022-10-22 MED ORDER — SODIUM CHLORIDE 0.9 % IV SOLN: 2.0000 g | Freq: Once | INTRAVENOUS | Status: DC

## 2022-10-22 NOTE — ED Notes (Signed)
Discharge paperwork given and verbally understood. 

## 2022-10-22 NOTE — ED Notes (Signed)
Infiltration of IV with approx 15cc contrast while pt was getting CT scan. RN made aware, safety zone portal done. Pt given a hot pack to help with pain & arm was elevated.

## 2022-10-22 NOTE — ED Provider Notes (Signed)
[facility] Provider Note   CSN: [id number] Arrival date & time: 10/22/22  2133     History {Add pertinent medical, surgical, social history, OB history to HPI:1} Chief Complaint  Patient presents with  . Abdominal Pain    Jenna Patterson is a 41 y.o. female.   Abdominal Pain   41 year old female presents emergency department with complaints of right upper quadrant abdominal pain.  Patient states that she was seen earlier in the emergency department today at [facility] with findings concerning for cholecystitis.  She states that her pain was controlled at that time.  She states that since discharge, her pain is gotten significantly worse.  She reports 1 episode of black stool.  Reports feelings of nausea with no episodes of emesis.  Denies fever, chills, night sweats, urinary/vaginal symptoms.  Past medical history significant for  Home Medications Prior to Admission medications   Medication Sig Start Date End Date Taking? Authorizing Provider  ALPRAZolam Duanne Moron) 0.5 MG tablet Take 0.5 mg at bedtime as needed by mouth for anxiety.    [provider]  amoxicillin-clavulanate (AUGMENTIN) 875-125 MG tablet Take 1 tablet by mouth every 12 (twelve) hours. 08/11/22   Regan Lemming, MD  amphetamine-dextroamphetamine (ADDERALL) 30 MG tablet Take 30 mg daily by mouth.    [provider]  dicyclomine (BENTYL) 20 MG tablet Take 1 tablet (20 mg total) by mouth 2 (two) times daily. 09/16/22   Gareth Morgan, MD  hydrOXYzine (VISTARIL) 25 MG capsule Take 25 mg by mouth daily.    [provider]  levothyroxine (SYNTHROID) 175 MCG tablet Take 1 tablet (175 mcg total) by mouth daily before breakfast. 04/03/18   Tresea Mall, CNM  norethindrone (MICRONOR) 0.35 MG tablet Take 1 tablet (0.35 mg total) by mouth daily. 08/25/22   Megan Salon, MD  ondansetron (ZOFRAN-ODT) 4 MG disintegrating tablet Take 1 tablet (4 mg total) by mouth every 8  (eight) hours as needed for nausea or vomiting. 09/16/22   Gareth Morgan, MD  pantoprazole (PROTONIX) 40 MG tablet Take 1 tablet (40 mg total) by mouth daily for 14 days. 09/16/22 09/30/22  Gareth Morgan, MD  Semaglutide,0.25 or 0.5MG/DOS, (OZEMPIC, 0.25 OR 0.5 MG/DOSE,) 2 MG/1.5ML SOPN Inject 2 mg into the skin once a week.    [provider]  Vitamin D, Ergocalciferol, (DRISDOL) 50000 units CAPS capsule Take 50,000 Units by mouth every 7 (seven) days.    [provider]      Allergies    Haloperidol    Review of Systems   Review of Systems  Gastrointestinal:  Positive for abdominal pain.    Physical Exam Updated Vital Signs BP (!) 144/110   Pulse 95   Temp 97.8 F (36.6 C) (Oral)   Resp 20   Ht _0  (1.575 m)   Wt 88 kg   LMP 10/03/2022   SpO2 97%   BMI 35.48 kg/m  Physical Exam  ED Results / Procedures / Treatments   Labs (all labs ordered are listed, but only abnormal results are displayed) Labs Reviewed  COMPREHENSIVE METABOLIC PANEL - Abnormal; Notable for the following components:      Result Value   Glucose, Bld 100 (*)    All other components within normal limits  LIPASE, BLOOD  CBC WITH DIFFERENTIAL/PLATELET  POC OCCULT BLOOD, ED    EKG None  Radiology US Abdomen Limited RUQ (LIVER/GB)  Result Date: 10/22/2022 CLINICAL DATA:  Right upper quadrant pain EXAM: ULTRASOUND ABDOMEN  LIMITED RIGHT UPPER QUADRANT COMPARISON:  09/16/2022 FINDINGS: Gallbladder: Diffuse gallbladder wall thickening up to 7 mm. No gallstones or pericholecystic fluid visualized. No sonographic Murphy sign noted by sonographer. Common bile duct: Diameter: 4 mm. Liver: No focal lesion identified. Mildly increased hepatic parenchymal echogenicity. Portal vein is patent on color Doppler imaging with normal direction of blood flow towards the liver. Other: None. IMPRESSION: 1. New gallbladder wall thickening without gallstones or pericholecystic fluid. This is a  nonspecific finding but can be seen in the setting of hepatitis, hypoalbuminemia, and other etiologies including cholecystitis. 2. The echogenicity of the liver is mildly increased. This is a nonspecific finding but is most commonly seen with fatty infiltration of the liver. There are no obvious focal liver lesions. Electronically Signed   By: Davina Poke D.O.   On: 10/22/2022 14:29   CT ABDOMEN PELVIS W CONTRAST  Result Date: 10/22/2022 CLINICAL DATA:  Epigastric pain, pain right upper quadrant EXAM: CT ABDOMEN AND PELVIS WITH CONTRAST TECHNIQUE: Multidetector CT imaging of the abdomen and pelvis was performed using the standard protocol following bolus administration of intravenous contrast. RADIATION DOSE REDUCTION: This exam was performed according to the departmental dose-optimization program which includes automated exposure control, adjustment of the mA and/or kV according to patient size and/or use of iterative reconstruction technique. CONTRAST:  175m OMNIPAQUE IOHEXOL 300 MG/ML  SOLN COMPARISON:  09/16/2022 FINDINGS: Lower chest: Unremarkable. Hepatobiliary: No focal abnormalities are seen in liver. There is no dilation of bile ducts. There is wall thickening in gallbladder. There is small amount of fluid around the gallbladder. Pancreas: No focal abnormalities are seen. Spleen: Unremarkable. Adrenals/Urinary Tract: Adrenals are unremarkable. There is no hydronephrosis. There are no renal or ureteral stones. Urinary bladder is not distended. Stomach/Bowel: Stomach is unremarkable. Small bowel loops are not dilated. Appendix is not dilated. There is no significant wall thickening in colon. There is no pericolic stranding. Vascular/Lymphatic: Unremarkable. Reproductive: Mild lobulations are seen in the margin of the uterus suggesting small fibroids. Surgical clips are seen in both sides of pelvis, possibly from previous tubal ligation. There are no dominant adnexal masses. Other: There is no  ascites or pneumoperitoneum. Small umbilical hernia containing fat is seen. Musculoskeletal: No acute findings are seen. IMPRESSION: There is wall thickening in gallbladder along with pericholecystic fluid suggesting possible acute cholecystitis. Gallbladder sonogram may be considered. There is no dilation of bile ducts. There is no evidence of intestinal obstruction or pneumoperitoneum. There is no hydronephrosis. Appendix is not dilated. Other findings as described in the body of the report. Electronically Signed   By: PElmer PickerM.D.   On: 10/22/2022 13:09    Procedures Procedures  {Document cardiac monitor, telemetry assessment procedure when appropriate:1}  Medications Ordered in ED Medications  cefTRIAXone (ROCEPHIN) 2 g in sodium chloride 0.9 % 100 mL IVPB (has no administration in time range)  morphine (PF) 4 MG/ML injection 4 mg (has no administration in time range)  ondansetron (ZOFRAN) injection 4 mg (has no administration in time range)    ED Course/ Medical Decision Making/ A&P Clinical Course as of 10/22/22 2338  [facility]  2337 Consulted general surgery Dr. TMarcello Mooresregarding the patient.  She does not think this is emergent condition given lack of elevation of alk phos, liver enzymes, white count and afebrile nature of patient.  Recommended outpatient therapy with PPI, pain medication, nausea medicine. [CR]    Clinical Course User Index [CR] RWilnette Kales PA  Medical Decision Making Amount and/or Complexity of Data Reviewed Labs: ordered.  Risk Prescription drug management.   ***  {Document critical care time when appropriate:1} {Document review of labs and clinical decision tools ie heart score, Chads2Vasc2 etc:1}  {Document your independent review of radiology images, and any outside records:1} {Document your discussion with family members, caretakers, and with consultants:1} {Document social determinants of health  affecting pt's care:1} {Document your decision making why or why not admission, treatments were needed:1} Final Clinical Impression(s) / ED Diagnoses Final diagnoses:  None    Rx / DC Orders ED Discharge Orders     None

## 2022-10-22 NOTE — ED Triage Notes (Signed)
RUQ "severe" abdominal pain that restarted tonight. Pt was seen at [facility] earlier today and discharged. Pt also reports black stools tonight.

## 2022-10-22 NOTE — ED Triage Notes (Signed)
Pt reports abdominal pain and nausea for the past two days. States the pain has gotten severe at times to where it causes dizziness. Pt had similar symptoms back in Sept. Reports the pain is worse at night.

## 2022-10-22 NOTE — ED Provider Notes (Signed)
[facility] EMERGENCY DEPT Provider Note   CSN: [id number] Arrival date & time: 10/22/22  1117     History  Chief Complaint  Patient presents with   Abdominal Pain    Jenna Patterson is a 41 y.o. female.  Pt is a 41 yo female presenting for abdominal pain. Pt admits to epigastric pain that radiates to the left upper abdomen and to the back. Admits to associated nausea without vomiting. Denies fevers. States pain is worse on an empty stomach. Has pain relief with hot showers. Denies cannabinoid use. Denies prior hx of acid reflux or peptic ulcer disease. Does not use Motrin/NSAIDs frequently. Does not drink alcohol. Denies black or blood stools. Had episode similar to today in September of this year. Chart review demonstrates negative RUQ [place] at that time and CT abd/plevis with iv contrast demonstrating retroperitoneal adenomas only.   The history is provided by the patient. No language interpreter was used.  Abdominal Pain Associated symptoms: nausea   Associated symptoms: no chest pain, no chills, no cough, no diarrhea, no dysuria, no fever, no hematuria, no shortness of breath, no sore throat and no vomiting        Home Medications Prior to Admission medications   Medication Sig Start Date End Date Taking? Authorizing Provider  ALPRAZolam Duanne Moron) 0.5 MG tablet Take 0.5 mg at bedtime as needed by mouth for anxiety.    [provider]  amoxicillin-clavulanate (AUGMENTIN) 875-125 MG tablet Take 1 tablet by mouth every 12 (twelve) hours. 08/11/22   Regan Lemming, MD  amphetamine-dextroamphetamine (ADDERALL) 30 MG tablet Take 30 mg daily by mouth.    [provider]  dicyclomine (BENTYL) 20 MG tablet Take 1 tablet (20 mg total) by mouth 2 (two) times daily. 09/16/22   Gareth Morgan, MD  hydrOXYzine (VISTARIL) 25 MG capsule Take 25 mg by mouth daily.    [provider]  levothyroxine (SYNTHROID) 175 MCG tablet Take 1 tablet (175 mcg total) by  mouth daily before breakfast. 04/03/18   Tresea Mall, CNM  norethindrone (MICRONOR) 0.35 MG tablet Take 1 tablet (0.35 mg total) by mouth daily. 08/25/22   Megan Salon, MD  ondansetron (ZOFRAN-ODT) 4 MG disintegrating tablet Take 1 tablet (4 mg total) by mouth every 8 (eight) hours as needed for nausea or vomiting. 09/16/22   Gareth Morgan, MD  pantoprazole (PROTONIX) 40 MG tablet Take 1 tablet (40 mg total) by mouth daily for 14 days. 09/16/22 09/30/22  Gareth Morgan, MD  Semaglutide,0.25 or 0.'5MG'$ /DOS, (OZEMPIC, 0.25 OR 0.5 MG/DOSE,) 2 MG/1.5ML SOPN Inject 2 mg into the skin once a week.    [provider]  Vitamin D, Ergocalciferol, (DRISDOL) 50000 units CAPS capsule Take 50,000 Units by mouth every 7 (seven) days.    [provider]      Allergies    Haloperidol    Review of Systems   Review of Systems  Constitutional:  Negative for chills and fever.  HENT:  Negative for ear pain and sore throat.   Eyes:  Negative for pain and visual disturbance.  Respiratory:  Negative for cough and shortness of breath.   Cardiovascular:  Negative for chest pain and palpitations.  Gastrointestinal:  Positive for abdominal pain and nausea. Negative for diarrhea and vomiting.  Genitourinary:  Negative for dysuria and hematuria.  Musculoskeletal:  Negative for arthralgias and back pain.  Skin:  Negative for color change and rash.  Neurological:  Negative for seizures and syncope.  All other systems reviewed  and are negative.   Physical Exam Updated Vital Signs BP 119/80 (BP Location: Right Arm)   Pulse 93   Temp 98.6 F (37 C) (Oral)   Resp 16   Ht '5\' 2"'$  (1.575 m)   Wt 88 kg   SpO2 100%   BMI 35.48 kg/m  Physical Exam Vitals and nursing note reviewed.  Constitutional:      General: She is not in acute distress.    Appearance: She is well-developed.  HENT:     Head: Normocephalic and atraumatic.  Eyes:     Conjunctiva/sclera: Conjunctivae normal.   Cardiovascular:     Rate and Rhythm: Normal rate and regular rhythm.     Heart sounds: No murmur heard. Pulmonary:     Effort: Pulmonary effort is normal. No respiratory distress.     Breath sounds: Normal breath sounds.  Abdominal:     Palpations: Abdomen is soft.     Tenderness: There is no abdominal tenderness.  Musculoskeletal:        General: No swelling.     Cervical back: Neck supple.  Skin:    General: Skin is warm and dry.     Capillary Refill: Capillary refill takes less than 2 seconds.  Neurological:     Mental Status: She is alert.  Psychiatric:        Mood and Affect: Mood normal.     ED Results / Procedures / Treatments   Labs (all labs ordered are listed, but only abnormal results are displayed) Labs Reviewed  COMPREHENSIVE METABOLIC PANEL - Abnormal; Notable for the following components:      Result Value   Glucose, Bld 100 (*)    All other components within normal limits  LIPASE, BLOOD  CBC  URINALYSIS, ROUTINE W REFLEX MICROSCOPIC  PREGNANCY, URINE    EKG None  Radiology US Abdomen Limited RUQ (LIVER/GB)  Result Date: 10/22/2022 CLINICAL DATA:  Right upper quadrant pain EXAM: ULTRASOUND ABDOMEN LIMITED RIGHT UPPER QUADRANT COMPARISON:  09/16/2022 FINDINGS: Gallbladder: Diffuse gallbladder wall thickening up to 7 mm. No gallstones or pericholecystic fluid visualized. No sonographic Murphy sign noted by sonographer. Common bile duct: Diameter: 4 mm. Liver: No focal lesion identified. Mildly increased hepatic parenchymal echogenicity. Portal vein is patent on color Doppler imaging with normal direction of blood flow towards the liver. Other: None. IMPRESSION: 1. New gallbladder wall thickening without gallstones or pericholecystic fluid. This is a nonspecific finding but can be seen in the setting of hepatitis, hypoalbuminemia, and other etiologies including cholecystitis. 2. The echogenicity of the liver is mildly increased. This is a nonspecific finding  but is most commonly seen with fatty infiltration of the liver. There are no obvious focal liver lesions. Electronically Signed   By: Davina Poke D.O.   On: 10/22/2022 14:29   CT ABDOMEN PELVIS W CONTRAST  Result Date: 10/22/2022 CLINICAL DATA:  Epigastric pain, pain right upper quadrant EXAM: CT ABDOMEN AND PELVIS WITH CONTRAST TECHNIQUE: Multidetector CT imaging of the abdomen and pelvis was performed using the standard protocol following bolus administration of intravenous contrast. RADIATION DOSE REDUCTION: This exam was performed according to the departmental dose-optimization program which includes automated exposure control, adjustment of the mA and/or kV according to patient size and/or use of iterative reconstruction technique. CONTRAST:  152m OMNIPAQUE IOHEXOL 300 MG/ML  SOLN COMPARISON:  09/16/2022 FINDINGS: Lower chest: Unremarkable. Hepatobiliary: No focal abnormalities are seen in liver. There is no dilation of bile ducts. There is wall thickening in gallbladder. There is small  amount of fluid around the gallbladder. Pancreas: No focal abnormalities are seen. Spleen: Unremarkable. Adrenals/Urinary Tract: Adrenals are unremarkable. There is no hydronephrosis. There are no renal or ureteral stones. Urinary bladder is not distended. Stomach/Bowel: Stomach is unremarkable. Small bowel loops are not dilated. Appendix is not dilated. There is no significant wall thickening in colon. There is no pericolic stranding. Vascular/Lymphatic: Unremarkable. Reproductive: Mild lobulations are seen in the margin of the uterus suggesting small fibroids. Surgical clips are seen in both sides of pelvis, possibly from previous tubal ligation. There are no dominant adnexal masses. Other: There is no ascites or pneumoperitoneum. Small umbilical hernia containing fat is seen. Musculoskeletal: No acute findings are seen. IMPRESSION: There is wall thickening in gallbladder along with pericholecystic fluid suggesting  possible acute cholecystitis. Gallbladder sonogram may be considered. There is no dilation of bile ducts. There is no evidence of intestinal obstruction or pneumoperitoneum. There is no hydronephrosis. Appendix is not dilated. Other findings as described in the body of the report. Electronically Signed   By: Elmer Picker M.D.   On: 10/22/2022 13:09    Procedures Procedures    Medications Ordered in ED Medications  famotidine (PEPCID) IVPB 20 mg premix (0 mg Intravenous Stopped 10/22/22 1403)  alum & mag hydroxide-simeth (MAALOX/MYLANTA) 200-200-20 MG/5ML suspension 30 mL (30 mLs Oral Given 10/22/22 1324)  ondansetron (ZOFRAN) injection 4 mg (4 mg Intravenous Given 10/22/22 1325)  iohexol (OMNIPAQUE) 300 MG/ML solution 100 mL (100 mLs Intravenous Contrast Given 10/22/22 1245)    ED Course/ Medical Decision Making/ A&P                           Medical Decision Making Amount and/or Complexity of Data Reviewed Labs: ordered. Radiology: ordered.  Risk OTC drugs. Prescription drug management.   3:07 PM 41 yo female presenting for abdominal pain.  Patient alert oriented x3, no acute distress, afebrile, so vital signs.  Physical exam demonstrates soft abdomen with no guarding or rigidity.  Tenderness to palpation of the epigastric and right upper quadrant region.  Diagnosis includes but is not limited to pancreatitis, cholecystitis, cholelithiasis, peptic ulcer disease, etc.  Laboratory studies as interpreted by myself demonstrate stable liver profile, bilirubins, lipase, and renal function.  No leukocytosis.  No signs or symptoms of sepsis.    CT abdomen with IV contrast demonstrates: -There is wall thickening in gallbladder along with pericholecystic fluid suggesting possible acute cholecystitis. Gallbladder sonogram may be considered. There is no dilation of bile ducts.  -There is no evidence of intestinal obstruction or pneumoperitoneum. -There is no hydronephrosis. Appendix is  not dilated.  Upper quadrant ultrasound demonstrates: 1. New gallbladder wall thickening without gallstones or pericholecystic fluid. This is a nonspecific finding but can be seen in the setting of hepatitis, hypoalbuminemia, and other etiologies including cholecystitis. 2. The echogenicity of the liver is mildly increased. This is a nonspecific finding but is most commonly seen with fatty infiltration of the liver. There are no obvious focal liver lesions.  Spoke with Jerene Pitch from Dr. Lavone Neri Thompson's general surgery team who has discussed the patient's current ultrasound and CT results.  They are recommending close outpatient follow-up and biliary colic dietary restrictions as long as patient's pain is controlled at this time.  Evaluation of patient demonstrates improvement of symptoms.  She safe for discharge at this time.  Outpatient follow-up given at discharge. Patient in no distress and overall condition improved here in the ED. Detailed discussions were  had with the patient regarding current findings, and need for close f/u with PCP or on call doctor. The patient has been instructed to return immediately if the symptoms worsen in any way for re-evaluation. Patient verbalized understanding and is in agreement with current care plan. All questions answered prior to discharge.         Final Clinical Impression(s) / ED Diagnoses Final diagnoses:  Right upper quadrant abdominal pain  Biliary colic    Rx / DC Orders ED Discharge Orders     None         Lianne Cure, DO 93/81/82 1507

## 2022-10-23 LAB — OCCULT BLOOD X 1 CARD TO LAB, STOOL: Fecal Occult Bld: NEGATIVE

## 2022-10-23 MED ORDER — HYDROCODONE-ACETAMINOPHEN 5-325 MG PO TABS: 1.0000 | ORAL_TABLET | Freq: Four times a day (QID) | ORAL | 0 refills | Status: AC | PRN

## 2022-10-23 MED ORDER — HYDROCODONE-ACETAMINOPHEN 5-325 MG PO TABS
1.0000 | ORAL_TABLET | Freq: Once | ORAL | Status: AC
  Administered 2022-10-23: 1 via ORAL
  Filled 2022-10-23: qty 1

## 2022-10-23 NOTE — ED Provider Notes (Signed)
Prior to discharge patient reports increased pain again. I have reviewed her recent ED visits. She has had similar issues dating back to the summer. She has normal labs. [place] from earlier in the day showed some gall bladder wall thickening. Prior provider spoke with General Surgery, no indication for admission or emergent cholecystectomy. Will give a dose of oral pain medications. Advised to follow up with General Surgery as an outpatient as planned.    Truddie Hidden, MD 10/23/22 [phone]

## 2022-10-23 NOTE — Discharge Instructions (Addendum)
Note your work-up today was overall given reassuring.  Your laboratory studies showed no acute abnormalities.  Take medication as prescribed.  Recommend follow-up with general surgery outpatient as discussed.  Please do not hesitate to return to emergency department for worrisome signs symptoms we discussed become apparent.

## 2022-10-25 ENCOUNTER — Ambulatory Visit: Payer: Self-pay | Admitting: Surgery

## 2022-10-25 NOTE — H&P (View-Only) (Signed)
REFERRING PHYSICIAN:  Self   Patient Care Team: Hellams, Jenetta Loges, [place] as PCP - General (Family Medicine) Colon Branch, MD (Gastroenterology) Johney Maine, Adrian Saran, MD as Consulting Provider (General Surgery)   PROVIDER:  Hollace Kinnier, MD   [facility] MRN: [id number] DOB: 1981-06-17     SUBJECTIVE    Chief Complaint: Abdominal Pain       History of Present Illness: Jenna Patterson is a 41 y.o. female who is seen today  as an office consultation at the request of Dr. Pasty Arch  for evaluation of Abdominal Pain .     Pleasant woman originally from [place].  Husband in the [place] at [facility] and will relocated to this region.  She has noted intermittent episodes of right upper quadrant abdominal pain.  Started happening more often in September.  Concerned her.  Went to the emergency department.  No evidence of any cholecystitis or abnormal labs.  She is intentionally trying to lose weight and she and her husband have a low fat diet.  She has had intermittent discomfort but then had a good month.  Then and got recurrent pain attacks.  Had a severe attack the days ago that concerned her.  Concern for some gallbladder wall thickening without stones.  No fever or leukocytosis and seem to stabilize with medication.  Recommendation for close outpatient follow-up.  Had another percent of pain and came back.  Again labs reassured.  Was fit in 3 days later in lab.   Patient notes that her pain is always there.  Nothing too severe but if she tries to eat solid food it comes raging back.  Feels nauseated and bloated.  Denies any heartburn or reflux.  She has had several pregnancies and this does not feel like the heartburn reflux then.  Last child in 2018.  She think she has some irritable bowel syndrome with some loose bowels.  Has had polyps in the past.  Had colonoscopy in 2023 with 1 adenomatous polyp.  3-year follow-up recommended by Dr. Gwenyth Allegra.  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  She normally can walk half hour without difficulty.  No diabetes or sleep apnea.  She has had 2 ligation and 2 C-sections.  No other abdominal surgeries.  Does not smoke.         Medical History:   Past Medical History      Past Medical History:  Diagnosis Date   Anxiety     Thyroid disease             Patient Active Problem List  Diagnosis   Chronic cholecystitis   Retroperitoneal mass   Umbilical hernia without obstruction and without gangrene      Past Surgical History       Past Surgical History:  Procedure Laterality Date   CESAREAN SECTION   03/11/2001   REPEAT CESAREAN SECTION   08/26/2017   LAPAROSCOPIC TUBAL LIGATION            Allergies      Allergies  Allergen Reactions   Haloperidol Anxiety              Current Outpatient Medications on File Prior to Visit  Medication Sig Dispense Refill   dextroamphetamine-amphetamine (ADDERALL) 30 mg tablet         levothyroxine (SYNTHROID) 175 MCG tablet Take by mouth       liothyronine (CYTOMEL) 5 MCG tablet TAKE 1 TABLET BY MOUTH EVERY DAY ON EMPTY STOMACH  No current facility-administered medications on file prior to visit.      Family History       Family History  Problem Relation Age of Onset   Obesity Mother     High blood pressure (Hypertension) Mother     Coronary Artery Disease (Blocked arteries around heart) Mother     Diabetes Father     Coronary Artery Disease (Blocked arteries around heart) Father     Hyperlipidemia (Elevated cholesterol) Father     High blood pressure (Hypertension) Father     Stroke Father     Diabetes Sister     Obesity Sister          Social History       Tobacco Use  Smoking Status Never  Smokeless Tobacco Never      Social History  Social History        Socioeconomic History   Marital status: Married  Tobacco Use   Smoking status: Never   Smokeless tobacco: Never  Substance and Sexual Activity   Alcohol use: Not Currently   Drug  use: Never        ############################################################   Review of Systems: A complete review of systems (ROS) was obtained from the patient.  I have reviewed this information and discussed as appropriate with the patient.  See HPI as well for other pertinent ROS.   Constitutional:  No fevers, chills, sweats.  Weight stable Eyes:  No vision changes, No discharge HENT:  No sore throats, nasal drainage Lymph: No neck swelling, No bruising easily Pulmonary:  No cough, productive sputum CV: No orthopnea, PND . No exertional chest/neck/shoulder/arm pain.  Patient can walk 30 minutes without difficulty.     GI:  No personal nor family history of GI/colon cancer, inflammatory bowel disease, irritable bowel syndrome, allergy such as Celiac Sprue, dietary/dairy problems, colitis, ulcers nor gastritis.  No recent sick contacts/gastroenteritis.  No travel outside the country.  No changes in diet.   Renal: No UTIs, No hematuria Genital:  No drainage, bleeding, masses Musculoskeletal: No severe joint pain.   Good ROM major joints Skin:  No sores or lesions Heme/Lymph:  No easy bleeding.  No swollen lymph nodes Neuro:  No active seizures.  No facial droop Psych:  No hallucinations.  No agitation   OBJECTIVE       Vitals:    10/25/22 0933  BP: 130/82  Pulse: 109  Temp: 37.1 C (98.7 F)  SpO2: 98%  Weight: 90.6 kg (199 lb 12.8 oz)  Height: 157.5 cm ('5\' 2"'$ )    Body mass index is 36.54 kg/m.   PHYSICAL EXAM:     Constitutional: Not cachectic.  Hygeine adequate.  Vitals signs as above.   Eyes: No glasses.  Vision adequate,Pupils reactive, normal extraocular movements. Sclera nonicteric Neuro: CN II-XII intact.  No major focal sensory defects.  No major motor deficits. Lymph: No head/neck/groin lymphadenopathy Psych:  No severe agitation.  No severe anxiety.  Judgment & insight Adequate, Oriented x4, HENT: Normocephalic, Mucus membranes moist.  No thrush.   Hearing: adequate Neck: Supple, No tracheal deviation.  No obvious thyromegaly Chest: No pain to chest wall compression.  Good respiratory excursion.  No audible wheezing CV:  Pulses intact.  regular.  No major extremity edema Ext: No obvious deformity or contracture.  Edema: Not present.  No cyanosis Skin: No major subcutaneous nodules.  Warm and dry Musculoskeletal: Severe joint rigidity not present.  No obvious clubbing.  No digital petechiae.  Mobility: no  assist device moving easily without restrictions   Abdomen:  Obese Soft.   Nondistended.  Mild discomfort in upper abdomen right upper quadrant without any guarding or Murphy sign.Marland Kitchen    Hernia: Present at: umbilicus , size 9M4QA. Diastasis recti: Not present.  No hepatomegaly.  No splenomegaly.   Genital/Pelvic:  Inguinal hernia: Not present.  Inguinal lymph nodes: without lymphadenopathy nor hidradenitis.     Rectal: (Deferred)         ###################################################################   Labs, Imaging and Diagnostic Testing:   Located in 'Care Everywhere' section of Epic EMR chart     [facility] NOTES:   Not applicable     SURGERY NOTES:   Not applicable     PATHOLOGY:   Not applicable       Assessment and Plan:  DIAGNOSES:   Diagnoses and all orders for this visit:   Chronic cholecystitis   Retroperitoneal mass   Umbilical hernia without obstruction and without gangrene   Obesity (BMI 30-39.9), unspecified       ASSESSMENT/PLAN   Classic story of biliary colic with right upper quadrant pain postprandial and gallbladder inflammation very suspicious for at least chronic cholecystitis.  There is no documentation of stones, her gallbladder has been abnormal in the differential diagnosis is very underwhelming.   The anatomy & physiology of hepatobiliary & pancreatic function was discussed.  The pathophysiology of gallbladder dysfunction was discussed.  Natural history risks without  surgery was discussed.   I feel the risks of no intervention will lead to serious problems that outweigh the operative risks; therefore, I recommended cholecystectomy to remove the pathology.  I explained laparoscopic techniques with possible need for an open approach.  Probable cholangiogram to evaluate the bilary tract was explained as well.  Possible need for a needle core biopsy if liver changes seen or history of abnormal liver labs   Risks such as bleeding, infection, diarrhea and other bowel changes, abscess, leak, injury to other organs, need for repair of tissues / organs, need for further treatment, stroke, heart attack, death, and other risks were discussed.  I noted a good likelihood this will help address the problem, but there is a chance it may not help.  Possibility that this will not correct all abdominal symptoms was explained.  Goals of post-operative recovery were discussed as well.  We will work to minimize complications.  An educational handout further explaining the pathology and treatment options was given as well.  Questions were answered.  The patient expresses understanding & wishes to proceed with surgery.     She does not have a Murphy sign today but she is having dwindling p.o. tolerance.  I think she needs urgent cholecystectomy this week.  We will see if I can fit her in this week myself or see if one of my partners can do it if I have no time.   I suspect she has a very tiny umbilical hernia.  Can plan single site approach or periumbilical port site and primarily repair that if needed.  I do not think explains her pain.   Some question of a retroperitoneal lipomatous mass along her left iliac vessels.  3 cm in size.  Looks like they found prior films that called a 2.3cm in 2015.  To my view, I do not see anything particularly distinctive or concerning.  Reasonably conservative and follow-up CAT scan in a year to prove stability.  Would defer to her primary care physician.    FOLLOWUP:  No follow-ups on file.   I have reviewed this patient's available data, including medical history, doctor notes, radiology & other studies, events of note, test results, physical exam, etc as part of my evaluation.   A significant portion of that time was spent in counseling in discussion of management, need for further testing, need for other medical or surgical input, etc.   Care was provided by me.   Based on my assessment, this care required MODERATE- Level 4 level of medical decision making.  10/25/2022     The plan was discussed in detail with the patient today, who expressed understanding & appreciation.  The patient has my contact information, and understands to call me with any additional questions or concerns.  I & my group would be happy to see the patient back sooner if the need arises.   Of note, portions of this report may have been transcribed using voice recognition software. Every effort was made to ensure accuracy; however, inadvertent computerized transcription errors may be present.   Any transcriptional errors that result from this process are unintentional.   ########################################################     Adin Hector, MD, FACS, MASCRS Esophageal, Gastrointestinal & Colorectal Surgery Robotic and Minimally Invasive Surgery   [facility] [place]. [address], [place], [place] [address] [phone] Fax [phone] Main

## 2022-10-25 NOTE — H&P (Signed)
REFERRING PHYSICIAN:  Self   Patient Care Team: Hellams, Jenetta Loges, [place] as PCP - General (Family Medicine) Colon Branch, MD (Gastroenterology) Johney Maine, Adrian Saran, MD as Consulting Provider (General Surgery)   PROVIDER:  Hollace Kinnier, MD   [facility] MRN: [id number] DOB: 1981/04/15     SUBJECTIVE    Chief Complaint: Abdominal Pain       History of Present Illness: Jenna Patterson is a 41 y.o. female who is seen today  as an office consultation at the request of Dr. Pasty Arch  for evaluation of Abdominal Pain .     Pleasant woman originally from [place].  Husband in the [place] at [facility] and will relocated to this region.  She has noted intermittent episodes of right upper quadrant abdominal pain.  Started happening more often in September.  Concerned her.  Went to the emergency department.  No evidence of any cholecystitis or abnormal labs.  She is intentionally trying to lose weight and she and her husband have a low fat diet.  She has had intermittent discomfort but then had a good month.  Then and got recurrent pain attacks.  Had a severe attack the days ago that concerned her.  Concern for some gallbladder wall thickening without stones.  No fever or leukocytosis and seem to stabilize with medication.  Recommendation for close outpatient follow-up.  Had another percent of pain and came back.  Again labs reassured.  Was fit in 3 days later in lab.   Patient notes that her pain is always there.  Nothing too severe but if she tries to eat solid food it comes raging back.  Feels nauseated and bloated.  Denies any heartburn or reflux.  She has had several pregnancies and this does not feel like the heartburn reflux then.  Last child in 2018.  She think she has some irritable bowel syndrome with some loose bowels.  Has had polyps in the past.  Had colonoscopy in 2023 with 1 adenomatous polyp.  3-year follow-up recommended by Dr. Gwenyth Allegra.  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  She normally can walk half hour without difficulty.  No diabetes or sleep apnea.  She has had 2 ligation and 2 C-sections.  No other abdominal surgeries.  Does not smoke.         Medical History:   Past Medical History      Past Medical History:  Diagnosis Date   Anxiety     Thyroid disease             Patient Active Problem List  Diagnosis   Chronic cholecystitis   Retroperitoneal mass   Umbilical hernia without obstruction and without gangrene      Past Surgical History       Past Surgical History:  Procedure Laterality Date   CESAREAN SECTION   03/11/2001   REPEAT CESAREAN SECTION   08/26/2017   LAPAROSCOPIC TUBAL LIGATION            Allergies      Allergies  Allergen Reactions   Haloperidol Anxiety              Current Outpatient Medications on File Prior to Visit  Medication Sig Dispense Refill   dextroamphetamine-amphetamine (ADDERALL) 30 mg tablet         levothyroxine (SYNTHROID) 175 MCG tablet Take by mouth       liothyronine (CYTOMEL) 5 MCG tablet TAKE 1 TABLET BY MOUTH EVERY DAY ON EMPTY STOMACH  No current facility-administered medications on file prior to visit.      Family History       Family History  Problem Relation Age of Onset   Obesity Mother     High blood pressure (Hypertension) Mother     Coronary Artery Disease (Blocked arteries around heart) Mother     Diabetes Father     Coronary Artery Disease (Blocked arteries around heart) Father     Hyperlipidemia (Elevated cholesterol) Father     High blood pressure (Hypertension) Father     Stroke Father     Diabetes Sister     Obesity Sister          Social History       Tobacco Use  Smoking Status Never  Smokeless Tobacco Never      Social History  Social History        Socioeconomic History   Marital status: Married  Tobacco Use   Smoking status: Never   Smokeless tobacco: Never  Substance and Sexual Activity   Alcohol use: Not Currently   Drug  use: Never        ############################################################   Review of Systems: A complete review of systems (ROS) was obtained from the patient.  I have reviewed this information and discussed as appropriate with the patient.  See HPI as well for other pertinent ROS.   Constitutional:  No fevers, chills, sweats.  Weight stable Eyes:  No vision changes, No discharge HENT:  No sore throats, nasal drainage Lymph: No neck swelling, No bruising easily Pulmonary:  No cough, productive sputum CV: No orthopnea, PND . No exertional chest/neck/shoulder/arm pain.  Patient can walk 30 minutes without difficulty.     GI:  No personal nor family history of GI/colon cancer, inflammatory bowel disease, irritable bowel syndrome, allergy such as Celiac Sprue, dietary/dairy problems, colitis, ulcers nor gastritis.  No recent sick contacts/gastroenteritis.  No travel outside the country.  No changes in diet.   Renal: No UTIs, No hematuria Genital:  No drainage, bleeding, masses Musculoskeletal: No severe joint pain.   Good ROM major joints Skin:  No sores or lesions Heme/Lymph:  No easy bleeding.  No swollen lymph nodes Neuro:  No active seizures.  No facial droop Psych:  No hallucinations.  No agitation   OBJECTIVE       Vitals:    10/25/22 0933  BP: 130/82  Pulse: 109  Temp: 37.1 C (98.7 F)  SpO2: 98%  Weight: 90.6 kg (199 lb 12.8 oz)  Height: 157.5 cm ('5\' 2"'$ )    Body mass index is 36.54 kg/m.   PHYSICAL EXAM:     Constitutional: Not cachectic.  Hygeine adequate.  Vitals signs as above.   Eyes: No glasses.  Vision adequate,Pupils reactive, normal extraocular movements. Sclera nonicteric Neuro: CN II-XII intact.  No major focal sensory defects.  No major motor deficits. Lymph: No head/neck/groin lymphadenopathy Psych:  No severe agitation.  No severe anxiety.  Judgment & insight Adequate, Oriented x4, HENT: Normocephalic, Mucus membranes moist.  No thrush.   Hearing: adequate Neck: Supple, No tracheal deviation.  No obvious thyromegaly Chest: No pain to chest wall compression.  Good respiratory excursion.  No audible wheezing CV:  Pulses intact.  regular.  No major extremity edema Ext: No obvious deformity or contracture.  Edema: Not present.  No cyanosis Skin: No major subcutaneous nodules.  Warm and dry Musculoskeletal: Severe joint rigidity not present.  No obvious clubbing.  No digital petechiae.  Mobility: no  assist device moving easily without restrictions   Abdomen:  Obese Soft.   Nondistended.  Mild discomfort in upper abdomen right upper quadrant without any guarding or Murphy sign.Marland Kitchen    Hernia: Present at: umbilicus , size [id number]. Diastasis recti: Not present.  No hepatomegaly.  No splenomegaly.   Genital/Pelvic:  Inguinal hernia: Not present.  Inguinal lymph nodes: without lymphadenopathy nor hidradenitis.     Rectal: (Deferred)         ###################################################################   Labs, Imaging and Diagnostic Testing:   Located in 'Care Everywhere' section of Epic EMR chart     PRIOR [facility] CLINIC NOTES:   Not applicable     SURGERY NOTES:   Not applicable     PATHOLOGY:   Not applicable       Assessment and Plan:  DIAGNOSES:   Diagnoses and all orders for this visit:   Chronic cholecystitis   Retroperitoneal mass   Umbilical hernia without obstruction and without gangrene   Obesity (BMI 30-39.9), unspecified       ASSESSMENT/PLAN   Classic story of biliary colic with right upper quadrant pain postprandial and gallbladder inflammation very suspicious for at least chronic cholecystitis.  There is no documentation of stones, her gallbladder has been abnormal in the differential diagnosis is very underwhelming.   The anatomy & physiology of hepatobiliary & pancreatic function was discussed.  The pathophysiology of gallbladder dysfunction was discussed.  Natural history risks without  surgery was discussed.   I feel the risks of no intervention will lead to serious problems that outweigh the operative risks; therefore, I recommended cholecystectomy to remove the pathology.  I explained laparoscopic techniques with possible need for an open approach.  Probable cholangiogram to evaluate the bilary tract was explained as well.  Possible need for a needle core biopsy if liver changes seen or history of abnormal liver labs   Risks such as bleeding, infection, diarrhea and other bowel changes, abscess, leak, injury to other organs, need for repair of tissues / organs, need for further treatment, stroke, heart attack, death, and other risks were discussed.  I noted a good likelihood this will help address the problem, but there is a chance it may not help.  Possibility that this will not correct all abdominal symptoms was explained.  Goals of post-operative recovery were discussed as well.  We will work to minimize complications.  An educational handout further explaining the pathology and treatment options was given as well.  Questions were answered.  The patient expresses understanding & wishes to proceed with surgery.     She does not have a Murphy sign today but she is having dwindling p.o. tolerance.  I think she needs urgent cholecystectomy this week.  We will see if I can fit her in this week myself or see if one of my partners can do it if I have no time.   I suspect she has a very tiny umbilical hernia.  Can plan single site approach or periumbilical port site and primarily repair that if needed.  I do not think explains her pain.   Some question of a retroperitoneal lipomatous mass along her left iliac vessels.  3 cm in size.  Looks like they found prior films that called a 2.3cm in 2015.  To my view, I do not see anything particularly distinctive or concerning.  Reasonably conservative and follow-up CAT scan in a year to prove stability.  Would defer to her primary care physician.    FOLLOWUP:  No follow-ups on file.   I have reviewed this patient's available data, including medical history, doctor notes, radiology & other studies, events of note, test results, physical exam, etc as part of my evaluation.   A significant portion of that time was spent in counseling in discussion of management, need for further testing, need for other medical or surgical input, etc.   Care was provided by me.   Based on my assessment, this care required MODERATE- Level 4 level of medical decision making.  10/25/2022     The plan was discussed in detail with the patient today, who expressed understanding & appreciation.  The patient has my contact information, and understands to call me with any additional questions or concerns.  I & my group would be happy to see the patient back sooner if the need arises.   Of note, portions of this report may have been transcribed using voice recognition software. Every effort was made to ensure accuracy; however, inadvertent computerized transcription errors may be present.   Any transcriptional errors that result from this process are unintentional.   ########################################################     Adin Hector, MD, FACS, MASCRS Esophageal, Gastrointestinal & Colorectal Surgery Robotic and Minimally Invasive Surgery   [facility] [place]. [address] [place], [place] [address] [phone] Fax [phone] Main

## 2022-10-25 NOTE — Progress Notes (Signed)
Anesthesia Review:  PCP: Cardiologist : Chest x-ray : EKG : 09/16/22  Echo : Stress test: Cardiac Cath :  Activity level:  Sleep Study/ CPAP : Fasting Blood Sugar :      / Checks Blood Sugar -- times a day:   Blood Thinner/ Instructions /Last Dose: ASA / Instructions/ Last Dose :  Ozempic- weekly  Labs- cmp, cbc/diff and urine preg done on 10/22/22.

## 2022-10-25 NOTE — Patient Instructions (Addendum)
SURGICAL WAITING ROOM VISITATION Patients having surgery or a procedure may have no more than 2 support people in the waiting area - these visitors may rotate.   Children under the age of 63 must have an adult with them who is not the patient. If the patient needs to stay at the hospital during part of their recovery, the visitor guidelines for inpatient rooms apply. Pre-op nurse will coordinate an appropriate time for 1 support person to accompany patient in pre-op.  This support person may not rotate.    Please refer to the [facility] website for the visitor guidelines for Inpatients (after your surgery is over and you are in a regular room).       Your procedure is scheduled on: 10/29/2022    Report to [facility] Main Entrance    Report to admitting at  [place] AM   Call this number if you have problems the morning of surgery [phone]   Do not eat food :After Midnight.   After Midnight you may have the following liquids until ____ 0430__ AM DAY OF SURGERY  Water Non-Citrus Juices (without pulp, NO RED) Carbonated Beverages Black Coffee (NO MILK/CREAM OR CREAMERS, sugar ok)  Clear Tea (NO MILK/CREAM OR CREAMERS, sugar ok) regular and decaf                             Plain Jell-O (NO RED)                                           Fruit ices (not with fruit pulp, NO RED)                                     Popsicles (NO RED)                                                               Sports drinks like Gatorade (NO RED)                           If you have questions, please contact your surgeon's office.       Oral Hygiene is also important to reduce your risk of infection.                                    Remember - BRUSH YOUR TEETH THE MORNING OF SURGERY WITH YOUR REGULAR TOOTHPASTE   Do NOT smoke after Midnight   Take these medicines the morning of surgery with A SIP OF WATER:  synthroid, protonix   DO NOT TAKE ANY ORAL DIABETIC MEDICATIONS DAY OF  YOUR SURGERY  Bring CPAP mask and tubing day of surgery.                              You may not have any metal on your body including hair pins, jewelry, and body piercing  Do not wear make-up, lotions, powders, perfumes/cologne, or deodorant  Do not wear nail polish including gel and S&S, artificial/acrylic nails, or any other type of covering on natural nails including finger and toenails. If you have artificial nails, gel coating, etc. that needs to be removed by a nail salon please have this removed prior to surgery or surgery may need to be canceled/ delayed if the surgeon/ anesthesia feels like they are unable to be safely monitored.   Do not shave  48 hours prior to surgery.               Men may shave face and neck.   Do not bring valuables to the hospital. [place].   Contacts, dentures or bridgework may not be worn into surgery.   Bring small overnight bag day of surgery.   DO NOT [place]. PHARMACY WILL DISPENSE MEDICATIONS LISTED ON YOUR MEDICATION LIST TO YOU DURING YOUR ADMISSION [place]!    Patients discharged on the day of surgery will not be allowed to drive home.  Someone NEEDS to stay with you for the first 24 hours after anesthesia.   Special Instructions: Bring a copy of your healthcare power of attorney and living will documents the day of surgery if you haven't scanned them before.              Please read over the following fact sheets you were given: IF [place] [phone]   If you received a COVID test during your pre-op visit  it is requested that you wear a mask when out in public, stay away from anyone that may not be feeling well and notify your surgeon if you develop symptoms. If you test positive for Covid or have been in contact with anyone that has tested positive in the last 10 days please  notify you surgeon.    [facility] [place] - Preparing for Surgery Before surgery, you can play an important role.  Because skin is not sterile, your skin needs to be as free of germs as possible.  You can reduce the number of germs on your skin by washing with CHG (chlorahexidine gluconate) soap before surgery.  CHG is an antiseptic cleaner which kills germs and bonds with the skin to continue killing germs even after washing. Please DO NOT use if you have an allergy to CHG or antibacterial soaps.  If your skin becomes reddened/irritated stop using the CHG and inform your nurse when you arrive at Short Stay. Do not shave (including legs and underarms) for at least 48 hours prior to the first CHG shower.  You may shave your face/neck. Please follow these instructions carefully:  1.  Shower with CHG Soap the night before surgery and the  morning of Surgery.  2.  If you choose to wash your hair, wash your hair first as usual with your  normal  shampoo.  3.  After you shampoo, rinse your hair and body thoroughly to remove the  shampoo.                           4.  Use CHG as you would any other liquid soap.  You can apply chg directly  to the skin and wash  Gently with a scrungie or clean washcloth.  5.  Apply the CHG Soap to your body ONLY FROM THE NECK DOWN.   Do not use on face/ open                           Wound or open sores. Avoid contact with eyes, ears mouth and genitals (private parts).                       Wash face,  Genitals (private parts) with your normal soap.             6.  Wash thoroughly, paying special attention to the area where your surgery  will be performed.  7.  Thoroughly rinse your body with warm water from the neck down.  8.  DO NOT shower/wash with your normal soap after using and rinsing off  the CHG Soap.                9.  Pat yourself dry with a clean towel.            10.  Wear clean pajamas.            11.  Place clean sheets on your bed the night  of your first shower and do not  sleep with pets. Day of Surgery : Do not apply any lotions/deodorants the morning of surgery.  Please wear clean clothes to the hospital/surgery center.  FAILURE TO FOLLOW THESE INSTRUCTIONS MAY RESULT IN THE CANCELLATION OF YOUR SURGERY PATIENT SIGNATURE_________________________________  NURSE SIGNATURE__________________________________   _______________________________________________________________________\

## 2022-10-26 ENCOUNTER — Ambulatory Visit: Payer: Self-pay | Admitting: Surgery

## 2022-10-26 ENCOUNTER — Encounter (HOSPITAL_COMMUNITY): Payer: Self-pay

## 2022-10-26 ENCOUNTER — Encounter (HOSPITAL_COMMUNITY)
Admission: RE | Admit: 2022-10-26 | Discharge: 2022-10-26 | Disposition: A | Discharge status: Home / Self Care | Source: Ambulatory Visit | Attending: Surgery | Admitting: Surgery

## 2022-10-26 ENCOUNTER — Other Ambulatory Visit: Payer: Self-pay

## 2022-10-26 DIAGNOSIS — Z01812 Encounter for preprocedural laboratory examination: Secondary | ICD-10-CM | POA: Insufficient documentation

## 2022-10-26 HISTORY — DX: Depression, unspecified: F32.A

## 2022-10-26 HISTORY — DX: Other specified postprocedural states: Z98.890

## 2022-10-26 HISTORY — DX: Anxiety disorder, unspecified: F41.9

## 2022-10-26 HISTORY — DX: Unspecified osteoarthritis, unspecified site: M19.90

## 2022-10-28 NOTE — Anesthesia Preprocedure Evaluation (Addendum)
Anesthesia Evaluation  Patient identified by MRN, date of birth, ID band Patient awake    Reviewed: Allergy & Precautions, NPO status , Patient's Chart, lab work & pertinent test results  History of Anesthesia Complications (+) PONV and history of anesthetic complications  Airway Mallampati: II  TM Distance: >3 FB Neck ROM: Full    Dental no notable dental hx. (+) Dental Advisory Given   Pulmonary neg pulmonary ROS   Pulmonary exam normal breath sounds clear to auscultation       Cardiovascular negative cardio ROS Normal cardiovascular exam Rhythm:Regular Rate:Normal  H/o murmur:  '13 ECHO: EF 65-70%, valves OK   Neuro/Psych negative neurological ROS     GI/Hepatic negative GI ROS, Neg liver ROS,,,  Endo/Other  Hypothyroidism    Renal/GU negative Renal ROS     Musculoskeletal  (+) Arthritis ,    Abdominal  (+) + obese  Peds  Hematology plt 324k   Anesthesia Other Findings   Reproductive/Obstetrics negative OB ROS                             Anesthesia Physical Anesthesia Plan  ASA: 3  Anesthesia Plan: General   Post-op Pain Management: Tylenol PO (pre-op)*, Gabapentin PO (pre-op)* and Celebrex PO (pre-op)*   Induction:   PONV Risk Score and Plan: 4 or greater and Ondansetron, Treatment may vary due to age or medical condition, Dexamethasone, Midazolam and Scopolamine patch - Pre-op  Airway Management Planned: Oral ETT  Additional Equipment:   Intra-op Plan:   Post-operative Plan: Extubation in OR  Informed Consent: I have reviewed the patients History and Physical, chart, labs and discussed the procedure including the risks, benefits and alternatives for the proposed anesthesia with the patient or authorized representative who has indicated his/her understanding and acceptance.     Dental advisory given  Plan Discussed with: CRNA  Anesthesia Plan Comments:          Anesthesia Quick Evaluation

## 2022-10-29 ENCOUNTER — Encounter (HOSPITAL_COMMUNITY): Payer: Self-pay | Admitting: Surgery

## 2022-10-29 ENCOUNTER — Ambulatory Visit (HOSPITAL_BASED_OUTPATIENT_CLINIC_OR_DEPARTMENT_OTHER): Admitting: Anesthesiology

## 2022-10-29 ENCOUNTER — Ambulatory Visit (HOSPITAL_COMMUNITY)
Admission: RE | Admit: 2022-10-29 | Discharge: 2022-10-29 | Disposition: A | Discharge status: Home / Self Care | Attending: Surgery | Admitting: Surgery

## 2022-10-29 ENCOUNTER — Other Ambulatory Visit: Payer: Self-pay

## 2022-10-29 ENCOUNTER — Ambulatory Visit (HOSPITAL_COMMUNITY): Admitting: Physician Assistant

## 2022-10-29 ENCOUNTER — Encounter (HOSPITAL_COMMUNITY)
Admission: RE | Disposition: A | Discharge status: Home / Self Care | Payer: Self-pay | Source: Home / Self Care | Attending: Surgery

## 2022-10-29 DIAGNOSIS — K8040 Calculus of bile duct with cholecystitis, unspecified, without obstruction: Secondary | ICD-10-CM

## 2022-10-29 DIAGNOSIS — M1990 Unspecified osteoarthritis, unspecified site: Secondary | ICD-10-CM

## 2022-10-29 DIAGNOSIS — E669 Obesity, unspecified: Secondary | ICD-10-CM | POA: Diagnosis not present

## 2022-10-29 DIAGNOSIS — Z6835 Body mass index (BMI) 35.0-35.9, adult: Secondary | ICD-10-CM

## 2022-10-29 DIAGNOSIS — Z6830 Body mass index (BMI) 30.0-30.9, adult: Secondary | ICD-10-CM | POA: Insufficient documentation

## 2022-10-29 DIAGNOSIS — K8044 Calculus of bile duct with chronic cholecystitis without obstruction: Secondary | ICD-10-CM | POA: Diagnosis present

## 2022-10-29 DIAGNOSIS — K8010 Calculus of gallbladder with chronic cholecystitis without obstruction: Secondary | ICD-10-CM | POA: Diagnosis not present

## 2022-10-29 DIAGNOSIS — E039 Hypothyroidism, unspecified: Secondary | ICD-10-CM

## 2022-10-29 DIAGNOSIS — K429 Umbilical hernia without obstruction or gangrene: Secondary | ICD-10-CM | POA: Diagnosis not present

## 2022-10-29 HISTORY — PX: CHOLECYSTECTOMY: SHX55

## 2022-10-29 LAB — POCT PREGNANCY, URINE: Preg Test, Ur: NEGATIVE

## 2022-10-29 SURGERY — LAPAROSCOPIC CHOLECYSTECTOMY WITH INTRAOPERATIVE CHOLANGIOGRAM
Anesthesia: General

## 2022-10-29 MED ORDER — ACETAMINOPHEN 650 MG RE SUPP: 650.0000 mg | RECTAL | Status: DC | PRN

## 2022-10-29 MED ORDER — CHLORHEXIDINE GLUCONATE 0.12 % MT SOLN
15.0000 mL | Freq: Once | OROMUCOSAL | Status: AC
  Administered 2022-10-29: 15 mL via OROMUCOSAL

## 2022-10-29 MED ORDER — PROMETHAZINE HCL 25 MG/ML IJ SOLN: 6.2500 mg | INTRAMUSCULAR | Status: DC | PRN

## 2022-10-29 MED ORDER — ROCURONIUM BROMIDE 10 MG/ML (PF) SYRINGE
PREFILLED_SYRINGE | INTRAVENOUS | Status: DC | PRN
  Administered 2022-10-29: 50 mg via INTRAVENOUS

## 2022-10-29 MED ORDER — ONDANSETRON HCL 4 MG/2ML IJ SOLN
INTRAMUSCULAR | Status: AC
  Filled 2022-10-29: qty 2

## 2022-10-29 MED ORDER — ONDANSETRON HCL 4 MG PO TABS: 4.0000 mg | ORAL_TABLET | Freq: Every day | ORAL | 1 refills | Status: AC | PRN

## 2022-10-29 MED ORDER — ROCURONIUM BROMIDE 10 MG/ML (PF) SYRINGE
PREFILLED_SYRINGE | INTRAVENOUS | Status: AC
  Filled 2022-10-29: qty 10

## 2022-10-29 MED ORDER — GABAPENTIN 300 MG PO CAPS: 300.0000 mg | ORAL_CAPSULE | Freq: Once | ORAL | Status: DC

## 2022-10-29 MED ORDER — BUPIVACAINE-EPINEPHRINE (PF) 0.25% -1:200000 IJ SOLN
INTRAMUSCULAR | Status: AC
  Filled 2022-10-29: qty 30

## 2022-10-29 MED ORDER — PROPOFOL 10 MG/ML IV BOLUS
INTRAVENOUS | Status: DC | PRN
  Administered 2022-10-29: 200 mg via INTRAVENOUS

## 2022-10-29 MED ORDER — CHLORHEXIDINE GLUCONATE 4 % EX LIQD: 60.0000 mL | Freq: Once | CUTANEOUS | Status: DC

## 2022-10-29 MED ORDER — ACETAMINOPHEN 500 MG PO TABS: 1000.0000 mg | ORAL_TABLET | ORAL | Status: DC

## 2022-10-29 MED ORDER — SUGAMMADEX SODIUM 200 MG/2ML IV SOLN
INTRAVENOUS | Status: DC | PRN
  Administered 2022-10-29: 200 mg via INTRAVENOUS

## 2022-10-29 MED ORDER — GABAPENTIN 300 MG PO CAPS
300.0000 mg | ORAL_CAPSULE | ORAL | Status: AC
  Administered 2022-10-29: 300 mg via ORAL
  Filled 2022-10-29: qty 1

## 2022-10-29 MED ORDER — OXYCODONE HCL 5 MG PO TABS
ORAL_TABLET | ORAL | Status: AC
  Administered 2022-10-29: 5 mg via ORAL
  Filled 2022-10-29: qty 1

## 2022-10-29 MED ORDER — 0.9 % SODIUM CHLORIDE (POUR BTL) OPTIME
TOPICAL | Status: DC | PRN
  Administered 2022-10-29: 1000 mL

## 2022-10-29 MED ORDER — DEXAMETHASONE SODIUM PHOSPHATE 10 MG/ML IJ SOLN
INTRAMUSCULAR | Status: AC
  Filled 2022-10-29: qty 1

## 2022-10-29 MED ORDER — BUPIVACAINE LIPOSOME 1.3 % IJ SUSP: 20.0000 mL | Freq: Once | INTRAMUSCULAR | Status: DC

## 2022-10-29 MED ORDER — OXYCODONE HCL 5 MG PO TABS: 5.0000 mg | ORAL_TABLET | ORAL | Status: DC | PRN

## 2022-10-29 MED ORDER — AMISULPRIDE (ANTIEMETIC) 5 MG/2ML IV SOLN: 10.0000 mg | Freq: Once | INTRAVENOUS | Status: AC | PRN

## 2022-10-29 MED ORDER — HYDROMORPHONE HCL 1 MG/ML IJ SOLN
INTRAMUSCULAR | Status: AC
  Administered 2022-10-29: 0.5 mg via INTRAVENOUS
  Filled 2022-10-29: qty 2

## 2022-10-29 MED ORDER — AMISULPRIDE (ANTIEMETIC) 5 MG/2ML IV SOLN
INTRAVENOUS | Status: AC
  Administered 2022-10-29: 10 mg via INTRAVENOUS
  Filled 2022-10-29: qty 4

## 2022-10-29 MED ORDER — DEXAMETHASONE SODIUM PHOSPHATE 10 MG/ML IJ SOLN
INTRAMUSCULAR | Status: DC | PRN
  Administered 2022-10-29: 10 mg via INTRAVENOUS

## 2022-10-29 MED ORDER — SODIUM CHLORIDE 0.9 % IV SOLN
2.0000 g | INTRAVENOUS | Status: DC
  Filled 2022-10-29: qty 20

## 2022-10-29 MED ORDER — SCOPOLAMINE 1 MG/3DAYS TD PT72
MEDICATED_PATCH | TRANSDERMAL | Status: AC
  Administered 2022-10-29: 1.5 mg via TRANSDERMAL
  Filled 2022-10-29: qty 1

## 2022-10-29 MED ORDER — MIDAZOLAM HCL 2 MG/2ML IJ SOLN
INTRAMUSCULAR | Status: AC
  Filled 2022-10-29: qty 2

## 2022-10-29 MED ORDER — TRAMADOL HCL 50 MG PO TABS: 50.0000 mg | ORAL_TABLET | Freq: Four times a day (QID) | ORAL | 0 refills | Status: AC | PRN

## 2022-10-29 MED ORDER — SODIUM CHLORIDE 0.9% FLUSH: 3.0000 mL | INTRAVENOUS | Status: DC | PRN

## 2022-10-29 MED ORDER — MEPERIDINE HCL 50 MG/ML IJ SOLN: 6.2500 mg | INTRAMUSCULAR | Status: DC | PRN

## 2022-10-29 MED ORDER — CEFAZOLIN SODIUM-DEXTROSE 2-4 GM/100ML-% IV SOLN
2.0000 g | INTRAVENOUS | Status: AC
  Administered 2022-10-29: 2 g via INTRAVENOUS
  Filled 2022-10-29: qty 100

## 2022-10-29 MED ORDER — ONDANSETRON HCL 4 MG/2ML IJ SOLN
INTRAMUSCULAR | Status: DC | PRN
  Administered 2022-10-29: 4 mg via INTRAVENOUS

## 2022-10-29 MED ORDER — CHLORHEXIDINE GLUCONATE CLOTH 2 % EX PADS: 6.0000 | MEDICATED_PAD | Freq: Once | CUTANEOUS | Status: DC

## 2022-10-29 MED ORDER — BUPIVACAINE LIPOSOME 1.3 % IJ SUSP
INTRAMUSCULAR | Status: AC
  Filled 2022-10-29: qty 20

## 2022-10-29 MED ORDER — ENSURE PRE-SURGERY PO LIQD: 296.0000 mL | Freq: Once | ORAL | Status: DC

## 2022-10-29 MED ORDER — SODIUM CHLORIDE 0.9 % IV SOLN: 250.0000 mL | INTRAVENOUS | Status: DC | PRN

## 2022-10-29 MED ORDER — HYDROMORPHONE HCL 1 MG/ML IJ SOLN
0.2500 mg | INTRAMUSCULAR | Status: DC | PRN
  Administered 2022-10-29: 0.25 mg via INTRAVENOUS
  Administered 2022-10-29 (×2): 0.5 mg via INTRAVENOUS

## 2022-10-29 MED ORDER — FENTANYL CITRATE PF 50 MCG/ML IJ SOSY: 25.0000 ug | PREFILLED_SYRINGE | INTRAMUSCULAR | Status: DC | PRN

## 2022-10-29 MED ORDER — ACETAMINOPHEN 500 MG PO TABS: 1000.0000 mg | ORAL_TABLET | Freq: Once | ORAL | Status: DC

## 2022-10-29 MED ORDER — BUPIVACAINE LIPOSOME 1.3 % IJ SUSP
INTRAMUSCULAR | Status: DC | PRN
  Administered 2022-10-29: 20 mL

## 2022-10-29 MED ORDER — LIDOCAINE HCL (PF) 2 % IJ SOLN
INTRAMUSCULAR | Status: AC
  Filled 2022-10-29: qty 5

## 2022-10-29 MED ORDER — GABAPENTIN 300 MG PO CAPS: 300.0000 mg | ORAL_CAPSULE | ORAL | Status: DC

## 2022-10-29 MED ORDER — ACETAMINOPHEN 500 MG PO TABS
1000.0000 mg | ORAL_TABLET | ORAL | Status: AC
  Administered 2022-10-29: 1000 mg via ORAL
  Filled 2022-10-29: qty 2

## 2022-10-29 MED ORDER — DOCUSATE SODIUM 100 MG PO CAPS: 100.0000 mg | ORAL_CAPSULE | Freq: Two times a day (BID) | ORAL | 0 refills | Status: AC

## 2022-10-29 MED ORDER — ACETAMINOPHEN 325 MG PO TABS: 650.0000 mg | ORAL_TABLET | ORAL | Status: DC | PRN

## 2022-10-29 MED ORDER — SCOPOLAMINE 1 MG/3DAYS TD PT72: 1.0000 | MEDICATED_PATCH | TRANSDERMAL | Status: DC

## 2022-10-29 MED ORDER — CELECOXIB 200 MG PO CAPS: 200.0000 mg | ORAL_CAPSULE | Freq: Once | ORAL | Status: DC

## 2022-10-29 MED ORDER — MIDAZOLAM HCL 2 MG/2ML IJ SOLN
INTRAMUSCULAR | Status: DC | PRN
  Administered 2022-10-29: 2 mg via INTRAVENOUS

## 2022-10-29 MED ORDER — FENTANYL CITRATE (PF) 250 MCG/5ML IJ SOLN
INTRAMUSCULAR | Status: AC
  Filled 2022-10-29: qty 5

## 2022-10-29 MED ORDER — BUPIVACAINE-EPINEPHRINE 0.25% -1:200000 IJ SOLN
INTRAMUSCULAR | Status: DC | PRN
  Administered 2022-10-29: 30 mL

## 2022-10-29 MED ORDER — INDOCYANINE GREEN 25 MG IV SOLR
7.5000 mg | Freq: Once | INTRAVENOUS | Status: AC
  Administered 2022-10-29: 7.5 mg via INTRAVENOUS
  Filled 2022-10-29: qty 3

## 2022-10-29 MED ORDER — LACTATED RINGERS IR SOLN
Status: DC | PRN
  Administered 2022-10-29: 1000 mL

## 2022-10-29 MED ORDER — SODIUM CHLORIDE 0.9% FLUSH: 3.0000 mL | Freq: Two times a day (BID) | INTRAVENOUS | Status: DC

## 2022-10-29 MED ORDER — LACTATED RINGERS IV SOLN: INTRAVENOUS | Status: DC

## 2022-10-29 MED ORDER — PROPOFOL 10 MG/ML IV BOLUS
INTRAVENOUS | Status: AC
  Filled 2022-10-29: qty 20

## 2022-10-29 MED ORDER — CELECOXIB 200 MG PO CAPS
200.0000 mg | ORAL_CAPSULE | ORAL | Status: AC
  Administered 2022-10-29: 200 mg via ORAL
  Filled 2022-10-29: qty 1

## 2022-10-29 MED ORDER — FENTANYL CITRATE (PF) 250 MCG/5ML IJ SOLN
INTRAMUSCULAR | Status: DC | PRN
  Administered 2022-10-29: 50 ug via INTRAVENOUS
  Administered 2022-10-29: 100 ug via INTRAVENOUS
  Administered 2022-10-29 (×2): 50 ug via INTRAVENOUS

## 2022-10-29 MED ORDER — LIDOCAINE 2% (20 MG/ML) 5 ML SYRINGE
INTRAMUSCULAR | Status: DC | PRN
  Administered 2022-10-29: 100 mg via INTRAVENOUS

## 2022-10-29 SURGICAL SUPPLY — 44 items
APPLIER CLIP ROT 10 11.4 M/L (STAPLE) ×1
BAG COUNTER SPONGE SURGICOUNT (BAG) ×1 IMPLANT
BENZOIN TINCTURE PRP APPL 2/3 (GAUZE/BANDAGES/DRESSINGS) IMPLANT
BNDG ADH 1X3 SHEER STRL LF (GAUZE/BANDAGES/DRESSINGS) IMPLANT
CABLE HIGH FREQUENCY MONO STRZ (ELECTRODE) ×1 IMPLANT
CHLORAPREP W/TINT 26 (MISCELLANEOUS) ×1 IMPLANT
CLIP APPLIE ROT 10 11.4 M/L (STAPLE) ×1 IMPLANT
COVER MAYO STAND XLG (MISCELLANEOUS) IMPLANT
COVER SURGICAL LIGHT HANDLE (MISCELLANEOUS) ×1 IMPLANT
DERMABOND ADVANCED .7 DNX12 (GAUZE/BANDAGES/DRESSINGS) IMPLANT
DRAPE C-ARM 42X120 X-RAY (DRAPES) IMPLANT
ELECT REM PT RETURN 15FT ADLT (MISCELLANEOUS) ×1 IMPLANT
ENDOLOOP SUT PDS II  0 18 (SUTURE) ×2
ENDOLOOP SUT PDS II 0 18 (SUTURE) IMPLANT
GLOVE BIO SURGEON STRL SZ 6 (GLOVE) ×1 IMPLANT
GLOVE INDICATOR 6.5 STRL GRN (GLOVE) ×1 IMPLANT
GLOVE SS BIOGEL STRL SZ 6 (GLOVE) ×1 IMPLANT
GOWN STRL REUS W/ TWL LRG LVL3 (GOWN DISPOSABLE) ×1 IMPLANT
GOWN STRL REUS W/ TWL XL LVL3 (GOWN DISPOSABLE) IMPLANT
GOWN STRL REUS W/TWL LRG LVL3 (GOWN DISPOSABLE) ×1
GOWN STRL REUS W/TWL XL LVL3 (GOWN DISPOSABLE)
GRASPER SUT TROCAR 14GX15 (MISCELLANEOUS) IMPLANT
HEMOSTAT SNOW SURGICEL 2X4 (HEMOSTASIS) IMPLANT
IRRIG SUCT STRYKERFLOW 2 WTIP (MISCELLANEOUS) ×1
IRRIGATION SUCT STRKRFLW 2 WTP (MISCELLANEOUS) ×1 IMPLANT
KIT BASIN OR (CUSTOM PROCEDURE TRAY) ×1 IMPLANT
KIT TURNOVER KIT A (KITS) IMPLANT
NDL INSUFFLATION 14GA 120MM (NEEDLE) ×1 IMPLANT
NEEDLE INSUFFLATION 14GA 120MM (NEEDLE) ×1 IMPLANT
SCISSORS LAP 5X35 DISP (ENDOMECHANICALS) ×1 IMPLANT
SET CHOLANGIOGRAPH MIX (MISCELLANEOUS) IMPLANT
SET TUBE SMOKE EVAC HIGH FLOW (TUBING) ×1 IMPLANT
SLEEVE Z-THREAD 5X100MM (TROCAR) ×2 IMPLANT
SPIKE FLUID TRANSFER (MISCELLANEOUS) ×1 IMPLANT
STRIP CLOSURE SKIN 1/2X4 (GAUZE/BANDAGES/DRESSINGS) IMPLANT
SUT MNCRL AB 4-0 PS2 18 (SUTURE) ×1 IMPLANT
SYS BAG RETRIEVAL 10MM (BASKET) ×1
SYSTEM BAG RETRIEVAL 10MM (BASKET) ×1 IMPLANT
TOWEL OR 17X26 10 PK STRL BLUE (TOWEL DISPOSABLE) ×1 IMPLANT
TOWEL OR NON WOVEN STRL DISP B (DISPOSABLE) IMPLANT
TRAY LAPAROSCOPIC (CUSTOM PROCEDURE TRAY) ×1 IMPLANT
TROCAR ADV FIXATION 12X100MM (TROCAR) ×1 IMPLANT
TROCAR XCEL NON-BLD 5MMX100MML (ENDOMECHANICALS) IMPLANT
TROCAR Z-THREAD OPTICAL 5X100M (TROCAR) ×1 IMPLANT

## 2022-10-29 NOTE — Anesthesia Postprocedure Evaluation (Signed)
Anesthesia Post Note  Patient: Jenna Patterson  Procedure(s) Performed: LAPAROSCOPIC CHOLECYSTECTOMY WITH ICG DYE     Patient location during evaluation: PACU Anesthesia Type: General Level of consciousness: sedated and patient cooperative Pain management: pain level controlled Vital Signs Assessment: post-procedure vital signs reviewed and stable Respiratory status: spontaneous breathing Cardiovascular status: stable Anesthetic complications: no   No notable events documented.  Last Vitals:  Vitals:   10/29/22 1100 10/29/22 1115  BP: 132/86 122/83  Pulse: (!) 103 99  Resp: 13 12  Temp:    SpO2: 92% 93%    Last Pain:  Vitals:   10/29/22 1115  TempSrc:   PainSc: [place]

## 2022-10-29 NOTE — Op Note (Signed)
Operative Note  Jenna Patterson 41 y.o. female [id number]  10/29/2022  Surgeon: Clovis Riley MD FACS  Procedure performed: Laparoscopic Cholecystectomy with near infrared fluorescent cholangiography  Procedure classification: urgent  Preop diagnosis: cholecystitis Post-op diagnosis/intraop findings: same  Specimens: gallbladder  Retained items: none  EBL: minimal  Complications: none  Description of procedure: After obtaining informed consent the patient was brought to the operating room. Antibiotics were administered. SCD's were applied. General endotracheal anesthesia was initiated and a formal time-out was performed. The abdomen was prepped and draped in the usual sterile fashion and the abdomen was entered using an infraumbilical veress needle after instilling the site with local. Insufflation to 758mHg was obtained, 537mtrocar and camera inserted, and gross inspection revealed no evidence of injury from our entry or other intraabdominal abnormalities. Two 58m4mrocars were introduced in the right midclavicular and right anterior axillary lines under direct visualization and following infiltration with local. An 41m86mocar was placed in the epigastrium.  The patient was placed in reverse Trendelenburg and rotated to the left.  Omental adhesions to the gallbladder were taken down bluntly and with cautery until the mandibular was visualized and could be grasped.  The gallbladder this was retracted cephalad and the infundibulum was retracted laterally. A combination of hook electrocautery and blunt dissection was utilized to clear the peritoneum from the neck and cystic duct, circumferentially isolating the cystic artery and cystic duct and lifting the gallbladder from the cystic plate. The critical view of safety was achieved with the cystic artery, cystic duct, and liver bed visualized between them with no other structures.  This was concordant with the view with near infrared  fluorescent cholangiography. The artery was clipped with a single clip proximally and distally and divided.  The cystic duct was somewhat dilated.  A clip was placed proximally and distally to control bile spillage and the cystic duct was divided sharply.  Two 0 PDS Endoloops were placed across the remnant cystic duct just proximal to the clip with good apposition. The gallbladder was dissected from the liver plate using electrocautery. Once freed the gallbladder was placed in an endocatch bag and removed through the epigastric trocar site.  The liver bed was hemostatic.  Some bile had been spilled from the gallbladder during its dissection from the liver bed. This was aspirated and the right upper quadrant was irrigated copiously until the effluent was clear. Hemostasis was once again confirmed, and reinspection of the abdomen revealed no injuries. The clips and Endoloops were well opposed without any bile leak from the duct or the liver bed. The 41mm51mcar site in the epigastrium was closed with a 0 vicryl in the fascia under direct visualization using a PMI device. The abdomen was desufflated and all trocars removed. The skin incisions were closed with subcuticular 4-0 monocryl and Dermabond. The patient was awakened, extubated and transported to the recovery room in stable condition.    All counts were correct at the completion of the case.

## 2022-10-29 NOTE — Interval H&P Note (Signed)
History and Physical Interval Note:  10/29/2022 6:49 AM  Jenna Patterson  has presented today for surgery, with the diagnosis of SYMPTOMATIC BILIARY COLIC, PROBABLE CHRONIC CHOLECYSTITIS.  The various methods of treatment have been discussed with the patient and family. After consideration of risks, benefits and other options for treatment, the patient has consented to Laparoscopic Cholecystectomy as a surgical intervention.  The patient's history has been reviewed, patient examined, no change in status, stable for surgery.  I have reviewed the patient's chart and labs.  Questions were answered to the patient's satisfaction.     Merelin Human Rich Brave

## 2022-10-29 NOTE — Discharge Instructions (Signed)
LAPAROSCOPIC SURGERY: POST OP INSTRUCTIONS   EAT Gradually transition to a high fiber diet with a fiber supplement over the next few weeks after discharge.  Start with a pureed / full liquid diet (see below)  WALK Walk an hour a day (cumulative- not all at once).  Control your pain to do that.    CONTROL PAIN Control pain so that you can walk, sleep, tolerate sneezing/coughing, go up/down stairs.  HAVE A BOWEL MOVEMENT DAILY Keep your bowels regular to avoid problems.  OK to try a laxative to override constipation.  OK to use an antidairrheal to slow down diarrhea.  Call if not better after 2 tries  CALL IF YOU HAVE PROBLEMS/CONCERNS Call if you are still struggling despite following these instructions. Call if you have concerns not answered by these instructions    DIET: Follow a light bland diet & liquids the first 24 hours after arrival home, such as soup, liquids, starches, etc.  Be sure to drink plenty of fluids.  Quickly advance to a usual solid diet within a few days.  Avoid fast food or heavy meals as your are more likely to get nauseated or have irregular bowels.  A low-sugar, high-fiber diet for the rest of your life is ideal.  Take your usually prescribed home medications unless otherwise directed.  PAIN CONTROL: Pain is best controlled by a usual combination of three different methods TOGETHER: Ice/Heat Over the counter pain medication Prescription pain medication Most patients will experience some swelling and bruising around the incisions.  Ice packs or heating pads (30-60 minutes up to 6 times a day) will help. Use ice for the first few days to help decrease swelling and bruising, then switch to heat to help relax tight/sore spots and speed recovery.  Some people prefer to use ice alone, heat alone, alternating between ice & heat.  Experiment to what works for you.  Swelling and bruising can take several weeks to resolve.   It is helpful to take an over-the-counter pain  medication regularly for the first few days: Naproxen (Aleve, etc)  Two 220mg tabs twice a day OR Ibuprofen (Advil, etc) Three 200mg tabs four times a day (every meal & bedtime) AND Acetaminophen (Tylenol, etc) 500-650mg four times a day (every meal & bedtime) A  prescription for pain medication (such as oxycodone, hydrocodone, tramadol, gabapentin, methocarbamol, etc) should be given to you upon discharge.  Take your pain medication as prescribed, IF NEEDED.  If you are having problems/concerns with the prescription medicine (does not control pain, nausea, vomiting, rash, itching, etc), please call us [phone] to see if we need to switch you to a different pain medicine that will work better for you and/or control your side effect better. If you need a refill on your pain medication, please give us 48 hour notice.  contact your pharmacy.  They will contact our office to request authorization. Prescriptions will not be filled after 5 pm or on week-ends  Avoid getting constipated.   Between the surgery and the pain medications, it is common to experience some constipation.   Increasing fluid intake and taking a fiber supplement (such as Metamucil, Citrucel, FiberCon, MiraLax, etc) 1-2 times a day regularly will usually help prevent this problem from occurring.   A mild laxative (prune juice, Milk of Magnesia, MiraLax, etc) should be taken according to package directions if there are no bowel movements after 48 hours.   Watch out for diarrhea.   If you have many loose   bowel movements, simplify your diet to bland foods & liquids for a few days.   Stop any stool softeners and decrease your fiber supplement.   Switching to mild anti-diarrheal medications (Kayopectate, Pepto Bismol) can help.   If this worsens or does not improve, please call us.  Wash / shower every day.  You may shower over the skin glue which is waterproof  Glue will flake off after about 2 weeks.  You may leave the incision  open to air.  You may replace a dressing/Band-Aid to cover the incision for comfort if you wish.   ACTIVITIES as tolerated:   You may resume regular (light) daily activities beginning the next day--such as daily self-care, walking, climbing stairs--gradually increasing activities as tolerated.  If you can walk 30 minutes without difficulty, it is safe to try more intense activity such as jogging, treadmill, bicycling, low-impact aerobics, swimming, etc. Save the most intensive and strenuous activity for last such as sit-ups, heavy lifting, contact sports, etc  Refrain from any heavy lifting or straining until you are off narcotics for pain control.   DO NOT PUSH THROUGH PAIN.  Let pain be your guide: If it hurts to do something, don't do it.  Pain is your body warning you to avoid that activity for another week until the pain goes down. You may drive when you are no longer taking prescription pain medication, you can comfortably wear a seatbelt, and you can safely maneuver your car and apply brakes. You may have sexual intercourse when it is comfortable.  FOLLOW UP in our office Please call [facility] at [phone] to set up an appointment to see your surgeon in the office for a follow-up appointment approximately 2-3 weeks after your surgery. Make sure that you call for this appointment the day you arrive home to insure a convenient appointment time.  10. IF YOU HAVE DISABILITY OR FAMILY LEAVE FORMS, BRING THEM TO THE OFFICE FOR PROCESSING.  DO NOT GIVE THEM TO YOUR DOCTOR.   WHEN TO CALL US [phone]: Poor pain control Reactions / problems with new medications (rash/itching, nausea, etc)  Fever over 101.5 F (38.5 C) Inability to urinate Nausea and/or vomiting Worsening swelling or bruising Continued bleeding from incision. Increased pain, redness, or drainage from the incision   The clinic staff is available to answer your questions during regular business hours (8:30am-5pm).  Please  don't hesitate to call and ask to speak to one of our nurses for clinical concerns.   If you have a medical emergency, go to the nearest emergency room or call 911.  A surgeon from [facility] is always on call at the hospitals   [facility] [address], [address], [place], [place]  [address] ? MAIN: [phone] ? TOLL FREE: [phone] ?  FAX [phone] [email or web address]  

## 2022-10-29 NOTE — Transfer of Care (Signed)
Immediate Anesthesia Transfer of Care Note  Patient: Jenna Patterson  Procedure(s) Performed: LAPAROSCOPIC CHOLECYSTECTOMY WITH ICG DYE  Patient Location: PACU  Anesthesia Type:General  Level of Consciousness: drowsy  Airway & Oxygen Therapy: Patient Spontanous Breathing and Patient connected to face mask oxygen  Post-op Assessment: Report given to RN and Post -op Vital signs reviewed and stable  Post vital signs: Reviewed and stable  Last Vitals:  Vitals Value Taken Time  BP 143/88 10/29/22 0848  Temp 36.6 C 10/29/22 0848  Pulse 107 10/29/22 0848  Resp 17 10/29/22 0848  SpO2 99 % 10/29/22 0848  Vitals shown include unvalidated device data.  Last Pain:  Vitals:   10/29/22 0604  TempSrc:   PainSc: 0-No pain         Complications: No notable events documented.

## 2022-10-29 NOTE — Anesthesia Procedure Notes (Signed)
Procedure Name: Intubation Date/Time: 10/29/2022 7:28 AM  Performed by: Sharlette Dense, CRNAPre-anesthesia Checklist: Patient identified, Emergency Drugs available, Suction available and Patient being monitored Patient Re-evaluated:Patient Re-evaluated prior to induction Oxygen Delivery Method: Circle system utilized Preoxygenation: Pre-oxygenation with 100% oxygen Induction Type: IV induction Ventilation: Mask ventilation without difficulty and Oral airway inserted - appropriate to patient size Laryngoscope Size: Sabra Heck and 2 Grade View: Grade I Tube type: Oral Tube size: 7.5 mm Number of attempts: 1 Airway Equipment and Method: Stylet and Oral airway Placement Confirmation: ETT inserted through vocal cords under direct vision, positive ETCO2 and breath sounds checked- equal and bilateral Secured at: 22 cm Tube secured with: Tape Dental Injury: Teeth and Oropharynx as per pre-operative assessment

## 2022-10-30 ENCOUNTER — Encounter (HOSPITAL_COMMUNITY): Payer: Self-pay | Admitting: Surgery

## 2022-11-01 LAB — SURGICAL PATHOLOGY

## 2022-12-15 ENCOUNTER — Ambulatory Visit (HOSPITAL_BASED_OUTPATIENT_CLINIC_OR_DEPARTMENT_OTHER): Admitting: Obstetrics & Gynecology

## 2022-12-21 ENCOUNTER — Ambulatory Visit (HOSPITAL_BASED_OUTPATIENT_CLINIC_OR_DEPARTMENT_OTHER): Admitting: Obstetrics & Gynecology

## 2023-03-02 ENCOUNTER — Ambulatory Visit
Admission: EM | Admit: 2023-03-02 | Discharge: 2023-03-02 | Disposition: A | Discharge status: Home / Self Care | Attending: Emergency Medicine | Admitting: Emergency Medicine

## 2023-03-02 ENCOUNTER — Encounter: Payer: Self-pay | Admitting: Emergency Medicine

## 2023-03-02 ENCOUNTER — Ambulatory Visit (INDEPENDENT_AMBULATORY_CARE_PROVIDER_SITE_OTHER)

## 2023-03-02 ENCOUNTER — Ambulatory Visit: Admit: 2023-03-02 | Discharge status: Against Medical Advice

## 2023-03-02 DIAGNOSIS — S92424A Nondisplaced fracture of distal phalanx of right great toe, initial encounter for closed fracture: Secondary | ICD-10-CM | POA: Diagnosis not present

## 2023-03-02 DIAGNOSIS — S99921A Unspecified injury of right foot, initial encounter: Secondary | ICD-10-CM

## 2023-03-02 DIAGNOSIS — S92414A Nondisplaced fracture of proximal phalanx of right great toe, initial encounter for closed fracture: Secondary | ICD-10-CM | POA: Diagnosis not present

## 2023-03-02 MED ORDER — IBUPROFEN 800 MG PO TABS: 800.0000 mg | ORAL_TABLET | Freq: Three times a day (TID) | ORAL | 0 refills | Status: AC

## 2023-03-02 NOTE — ED Provider Notes (Signed)
Jenna [facility]    CSN: [id number] Arrival date & time: 03/02/23  1522      History   Chief Complaint Chief Complaint  Patient presents with   Toe Injury    Need xray of big toe. - Entered by patient    HPI Jenna Patterson is a 42 y.o. female.  Last night hit big toe on table Right foot 5/10 pain. Reports swelling and bruising Has tried ibuprofen and ice  Past Medical History:  Diagnosis Date   Adenomatous polyp    Anxiety    Arthritis    Depression    Hypothyroidism    LGA (large for gestational age) fetus affecting management of mother 08/09/2017   At 71 weeks (08/09/2017): Estimated fetal weight is 3235g which is growth in the 83rd  percentile. AC is in the 95th percentile '[ ]'$  repeat scan in 4 weeks   Mitral valve regurgitation    trace amount by echo in 2013   Normal cardiac stress test    EF 65%, no ischemia 05/09/2012 at [place]   Palpitation    PONV (postoperative nausea and vomiting)    Thyroid disease    Ventricular hypertrophy left   mild LVH seen on previous echo on 04/18/2012 at [place] in [facility]    Patient Active Problem List   Diagnosis Date Noted   Menorrhagia with regular cycle 07/01/2022   Vitamin D deficiency 03/12/2017   ASCUS with positive high risk HPV cervical 01/26/2017   Hypothyroid 01/20/2017   Ventricular hypertrophy     Past Surgical History:  Procedure Laterality Date   CESAREAN SECTION     CESAREAN SECTION N/A 08/26/2017   Procedure: REPEAT CESAREAN SECTION;  Surgeon: Chancy Milroy, MD;  Location: [facility];  Service: Obstetrics;  Laterality: N/A;   CHOLECYSTECTOMY N/A 10/29/2022   Procedure: LAPAROSCOPIC CHOLECYSTECTOMY WITH ICG DYE;  Surgeon: Clovis Riley, MD;  Location: [facility];  Service: General;  Laterality: N/A;  GEN w/ERAS PATHWAY   COLONOSCOPY     DILATION AND CURETTAGE OF UTERUS     TUBAL LIGATION Bilateral 08/26/2017   Procedure: BILATERAL TUBAL LIGATION;  Surgeon: Chancy Milroy, MD;  Location: [facility];  Service: Obstetrics;  Laterality: Bilateral;    OB History     Gravida  3   Para  1   Term  1   Preterm  0   AB  1   Living  1      SAB  1   IAB  0   Ectopic  0   Multiple  0   Live Births  1            Home Medications    Prior to Admission medications   Medication Sig Start Date End Date Taking? Authorizing Provider  ibuprofen (ADVIL) 800 MG tablet Take 1 tablet (800 mg total) by mouth 3 (three) times daily. 03/02/23  Yes Jeramyah Goodpasture, Wells Guiles, PA-C  ALPRAZolam Duanne Moron) 0.5 MG tablet Take 0.5 mg by mouth at bedtime as needed for anxiety or sleep.    [provider]  amphetamine-dextroamphetamine (ADDERALL) 30 MG tablet Take 30 mg by mouth 2 (two) times daily.    [provider]  Cholecalciferol (VITAMIN D3) 250 MCG (10000 UT) capsule Take 10,000 Units by mouth daily.    [provider]  cyanocobalamin (VITAMIN B12) 1000 MCG tablet Take 1,000 mcg by mouth daily.    [provider]  HYDROcodone-acetaminophen (NORCO/VICODIN) 5-325 MG  tablet Take 1 tablet by mouth every 6 (six) hours as needed for severe pain. 10/23/22   Truddie Hidden, MD  levothyroxine (SYNTHROID) 175 MCG tablet Take 1 tablet (175 mcg total) by mouth daily before breakfast. 04/03/18   Marcille Buffy D, CNM  liothyronine (CYTOMEL) 5 MCG tablet Take 5 mcg by mouth daily. 10/21/22   [provider]  Magnesium 250 MG TABS Take 250 mg by mouth daily.    [provider]  Multiple Vitamin (MULTIVITAMIN) tablet Take 1 tablet by mouth daily.    [provider]  Omega-3 1000 MG CAPS Take 1,000 mg by mouth daily.    [provider]  ondansetron (ZOFRAN) 4 MG tablet Take 1 tablet (4 mg total) by mouth daily as needed for nausea or vomiting. 10/29/22 10/29/23  Clovis Riley, MD  selenium (SELENIMIN-200) 200 MCG TABS tablet Take 200 mcg by mouth daily.    [provider]    Family  History Family History  Problem Relation Age of Onset   Thyroid disease Mother    Thyroid disease Father    CAD Father        CABG x4 age 25   Thyroid disease Sister    Heart attack Paternal Grandfather     Social History Social History   Tobacco Use   Smoking status: Never   Smokeless tobacco: Never  Vaping Use   Vaping Use: Never used  Substance Use Topics   Alcohol use: No   Drug use: No     Allergies   Haloperidol   Review of Systems Review of Systems As per HPI  Physical Exam Triage Vital Signs ED Triage Vitals  Enc Vitals Group     BP 03/02/23 1538 (!) 139/92     Pulse Rate 03/02/23 1538 91     Resp 03/02/23 1538 16     Temp 03/02/23 1538 98.1 F (36.7 C)     Temp Source 03/02/23 1538 Oral     SpO2 03/02/23 1538 99 %     Weight --      Height --      Head Circumference --      Peak Flow --      Pain Score 03/02/23 1539 5     Pain Loc --      Pain Edu? --      Excl. in [place]? --    No data found.  Updated Vital Signs BP (!) 139/92 (BP Location: Right Arm)   Pulse 91   Temp 98.1 F (36.7 C) (Oral)   Resp 16   LMP 02/17/2023   SpO2 99%    Physical Exam Vitals reviewed.  Constitutional:      General: She is not in acute distress. Cardiovascular:     Rate and Rhythm: Normal rate and regular rhythm.     Pulses: Normal pulses.  Pulmonary:     Effort: Pulmonary effort is normal.  Musculoskeletal:        General: Swelling, tenderness and signs of injury present.  Feet:     Comments: Bruising at great toe. Tender to touch.  Skin:    General: Skin is warm and dry.     Capillary Refill: Capillary refill takes less than 2 seconds.     Findings: Bruising present.  Neurological:     Mental Status: She is alert and oriented to person, place, and time.     Comments: Antalgic gait     UC Treatments / Results  Labs (  all labs ordered are listed, but only abnormal results are displayed) Labs Reviewed - No data to  display  EKG   Radiology DG Foot Complete Right  Result Date: 03/02/2023 CLINICAL DATA:  Slip and fall, injured toe last night EXAM: RIGHT FOOT COMPLETE - 3+ VIEW COMPARISON:  None Available. FINDINGS: Nondisplaced oblique intra-articular fracture through the medial base of the right great toe distal phalanx and an extra-articular oblique fracture through the right great toe proximal phalanx. No other fracture of the right foot. There is no evidence of arthropathy or other focal bone abnormality. Diffuse soft tissue edema about the great toe. IMPRESSION: Nondisplaced oblique intra-articular fracture through the medial base of the right great toe distal phalanx and an extra-articular oblique fracture through the right great toe proximal phalanx. No other fracture of the right foot. Electronically Signed   By: Delanna Ahmadi M.D.   On: 03/02/2023 16:11    Procedures Procedures (including critical care time)  Medications Ordered in UC Medications - No data to display  Initial Impression / Assessment and Plan / UC Course  I have reviewed the triage vital signs and the nursing notes.  Pertinent labs & imaging results that were available during my care of the patient were reviewed by me and considered in my medical decision making (see chart for details).  R foot xray with nondisplaced fracture of distal phalanx, intra-articular. There is also an oblique fracture of proximal phalanx. Placed in post-op shoe Discussed RICE therapy. Ortho info provided for follow up  Final Clinical Impressions(s) / UC Diagnoses   Final diagnoses:  Nondisplaced fracture of distal phalanx of right great toe, initial encounter for closed fracture  Closed nondisplaced fracture of proximal phalanx of right great toe, initial encounter     Discharge Instructions      Rest - try to avoid high impact activity Ice - apply for 20 minutes a few times daily Support - please wear the provided shoe while walking or  standing Elevation - prop up on a pillow  Call the orthopedic specialist for follow up.     ED Prescriptions     Medication Sig Dispense Auth. Provider   ibuprofen (ADVIL) 800 MG tablet Take 1 tablet (800 mg total) by mouth 3 (three) times daily. 21 tablet Antoinett Dorman, Wells Guiles, PA-C      PDMP not reviewed this encounter.   Kyra Leyland 03/02/23 1630

## 2023-03-02 NOTE — ED Triage Notes (Signed)
Pt states she fell and slipped and hit her big toe on edge of table last night. C/o right great toe pain, swelling bruising.

## 2023-03-02 NOTE — Discharge Instructions (Addendum)
Rest - try to avoid high impact activity Ice - apply for 20 minutes a few times daily Support - please wear the provided shoe while walking or standing Elevation - prop up on a pillow  Call the orthopedic specialist for follow up.

## 2023-08-08 ENCOUNTER — Emergency Department (HOSPITAL_BASED_OUTPATIENT_CLINIC_OR_DEPARTMENT_OTHER)
Admission: EM | Admit: 2023-08-08 | Discharge: 2023-08-08 | Disposition: A | Discharge status: Home / Self Care | Attending: Emergency Medicine | Admitting: Emergency Medicine

## 2023-08-08 ENCOUNTER — Other Ambulatory Visit: Payer: Self-pay

## 2023-08-08 ENCOUNTER — Encounter (HOSPITAL_BASED_OUTPATIENT_CLINIC_OR_DEPARTMENT_OTHER): Payer: Self-pay | Admitting: Emergency Medicine

## 2023-08-08 DIAGNOSIS — K0889 Other specified disorders of teeth and supporting structures: Secondary | ICD-10-CM | POA: Diagnosis present

## 2023-08-08 DIAGNOSIS — K029 Dental caries, unspecified: Secondary | ICD-10-CM | POA: Insufficient documentation

## 2023-08-08 MED ORDER — PENICILLIN V POTASSIUM 250 MG PO TABS
500.0000 mg | ORAL_TABLET | Freq: Once | ORAL | Status: AC
  Administered 2023-08-08: 500 mg via ORAL
  Filled 2023-08-08: qty 2

## 2023-08-08 MED ORDER — LIDOCAINE VISCOUS HCL 2 % MT SOLN: 5.0000 mL | Freq: Three times a day (TID) | OROMUCOSAL | 0 refills | Status: AC

## 2023-08-08 MED ORDER — PENICILLIN V POTASSIUM 500 MG PO TABS: 500.0000 mg | ORAL_TABLET | Freq: Four times a day (QID) | ORAL | 0 refills | Status: AC

## 2023-08-08 MED ORDER — LIDOCAINE VISCOUS HCL 2 % MT SOLN
15.0000 mL | Freq: Once | OROMUCOSAL | Status: AC
  Administered 2023-08-08: 15 mL via OROMUCOSAL
  Filled 2023-08-08: qty 15

## 2023-08-08 NOTE — ED Provider Notes (Signed)
[facility] Provider Note   CSN: [id number] Arrival date & time: 08/08/23  0450     History  Chief Complaint  Patient presents with   Dental Pain    Jenna Patterson is a 42 y.o. female.  The history is provided by the patient.  Dental Pain Location:  Upper Upper teeth location:  2/RU 2nd molar Quality:  Aching Severity:  Severe Onset quality:  Gradual Duration:  3 days Timing:  Constant Progression:  Worsening Chronicity:  New Context: not malocclusion and normal dentition   Previous work-up:  Dental exam Relieved by:  Nothing Worsened by:  Nothing Ineffective treatments:  None tried Associated symptoms: no congestion, no facial swelling, no fever, no neck pain and no trismus   Risk factors: no alcohol problem        Home Medications Prior to Admission medications   Medication Sig Start Date End Date Taking? Authorizing Provider  ALPRAZolam Prudy Feeler) 0.5 MG tablet Take 0.5 mg by mouth at bedtime as needed for anxiety or sleep.    [provider]  amphetamine-dextroamphetamine (ADDERALL) 30 MG tablet Take 30 mg by mouth 2 (two) times daily.    [provider]  Cholecalciferol (VITAMIN D3) 250 MCG (10000 UT) capsule Take 10,000 Units by mouth daily.    [provider]  cyanocobalamin (VITAMIN B12) 1000 MCG tablet Take 1,000 mcg by mouth daily.    [provider]  HYDROcodone-acetaminophen (NORCO/VICODIN) 5-325 MG tablet Take 1 tablet by mouth every 6 (six) hours as needed for severe pain. 10/23/22   Pollyann Savoy, MD  ibuprofen (ADVIL) 800 MG tablet Take 1 tablet (800 mg total) by mouth 3 (three) times daily. 03/02/23   Rising, Lurena Joiner, PA-C  levothyroxine (SYNTHROID) 175 MCG tablet Take 1 tablet (175 mcg total) by mouth daily before breakfast. 04/03/18   Thressa Sheller D, CNM  liothyronine (CYTOMEL) 5 MCG tablet Take 5 mcg by mouth daily. 10/21/22   [provider]  magic  mouthwash (lidocaine, diphenhydrAMINE, alum & mag hydroxide) suspension Swish and spit 5 mLs 3 (three) times daily. 08/08/23  Yes Milissa Fesperman, MD  Magnesium 250 MG TABS Take 250 mg by mouth daily.    [provider]  Multiple Vitamin (MULTIVITAMIN) tablet Take 1 tablet by mouth daily.    [provider]  Omega-3 1000 MG CAPS Take 1,000 mg by mouth daily.    [provider]  ondansetron (ZOFRAN) 4 MG tablet Take 1 tablet (4 mg total) by mouth daily as needed for nausea or vomiting. 10/29/22 10/29/23  Berna Bue, MD  penicillin v potassium (VEETID) 500 MG tablet Take 1 tablet (500 mg total) by mouth 4 (four) times daily for 7 days. 08/08/23 08/15/23 Yes Shealee Yordy, MD  selenium (SELENIMIN-200) 200 MCG TABS tablet Take 200 mcg by mouth daily.    [provider]      Allergies    Haloperidol    Review of Systems   Review of Systems  Constitutional:  Negative for fever.  HENT:  Positive for dental problem. Negative for congestion and facial swelling.   Eyes:  Negative for redness.  Respiratory:  Negative for wheezing and stridor.   Musculoskeletal:  Negative for neck pain.    Physical Exam Updated Vital Signs BP (!) 167/107 (BP Location: Right Arm)   Pulse 100   Temp 98.5 F (36.9 C) (Oral)   Resp (!) 21   Ht 5\' 2"  (1.575 m)   Wt  117.9 kg   LMP 07/16/2023 (Approximate)   SpO2 100%   BMI 47.55 kg/m  Physical Exam Vitals and nursing note reviewed.  Constitutional:      General: She is not in acute distress.    Appearance: Normal appearance. She is well-developed.  HENT:     Head: Normocephalic and atraumatic.     Nose: Nose normal.     Mouth/Throat:     Mouth: Mucous membranes are moist.     Pharynx: Oropharynx is clear.     Comments: Dental caries  Eyes:     Pupils: Pupils are equal, round, and reactive to light.  Cardiovascular:     Rate and Rhythm: Normal rate and regular rhythm.     Pulses: Normal pulses.     Heart sounds:  Normal heart sounds.  Pulmonary:     Effort: Pulmonary effort is normal. No respiratory distress.     Breath sounds: Normal breath sounds.  Abdominal:     General: Bowel sounds are normal. There is no distension.     Palpations: Abdomen is soft.     Tenderness: There is no abdominal tenderness. There is no guarding or rebound.  Genitourinary:    Vagina: No vaginal discharge.  Musculoskeletal:        General: Normal range of motion.     Cervical back: Normal range of motion and neck supple.  Skin:    General: Skin is warm and dry.     Capillary Refill: Capillary refill takes less than 2 seconds.     Findings: No erythema or rash.  Neurological:     General: No focal deficit present.     Mental Status: She is alert and oriented to person, place, and time.     Deep Tendon Reflexes: Reflexes normal.  Psychiatric:        Mood and Affect: Mood normal.     ED Results / Procedures / Treatments   Labs (all labs ordered are listed, but only abnormal results are displayed) Labs Reviewed - No data to display  EKG None  Radiology No results found.  Procedures Procedures    Medications Ordered in ED Medications  penicillin v potassium (VEETID) tablet 500 mg (has no administration in time range)  lidocaine (XYLOCAINE) 2 % viscous mouth solution 15 mL (has no administration in time range)    ED Course/ Medical Decision Making/ A&P                                 Medical Decision Making Patient with 3 days of dental pain is seeing a dentist tomorrow   Amount and/or Complexity of Data Reviewed External Data Reviewed: notes.    Details: Previous notes reviewed   Risk Prescription drug management. Risk Details: No facial swelling, no trismus.  I have started penicillin and magic mouthwash for pain relief.  I have instructed patient to follow up with dentistry for definitive care.  Stable for discharge.      Final Clinical Impression(s) / ED Diagnoses Final diagnoses:   Pain due to dental caries   Return for intractable cough, coughing up blood, fevers > 100.4 unrelieved by medication, shortness of breath, intractable vomiting, chest pain, shortness of breath, weakness, numbness, changes in speech, facial asymmetry, abdominal pain, passing out, Inability to tolerate liquids or food, cough, altered mental status or any concerns. No signs of systemic illness or infection. The patient is nontoxic-appearing on exam and vital  signs are within normal limits.  I have reviewed the triage vital signs and the nursing notes. Pertinent labs & imaging results that were available during my care of the patient were reviewed by me and considered in my medical decision making (see chart for details). After history, exam, and medical workup I feel the patient has been appropriately medically screened and is safe for discharge home. Pertinent diagnoses were discussed with the patient. Patient was given return precautions.  Rx / DC Orders ED Discharge Orders          Ordered    penicillin v potassium (VEETID) 500 MG tablet  4 times daily        08/08/23 0518    magic mouthwash (lidocaine, diphenhydrAMINE, alum & mag hydroxide) suspension  3 times daily        08/08/23 0518              Amauris Debois, MD 08/08/23 8295

## 2023-08-08 NOTE — ED Triage Notes (Signed)
Patient arrived via POV c/o dental pain x 3 days. Patient states taking ibuprofen and tylenol without relief. Patient states upper right rear molar. Patient states "bad taste in mouth". Patient is AO x 4, VS WDL, normal gait.
